# Patient Record
Sex: Male | Born: 1950 | Race: White | Hispanic: No | Marital: Married | Smoking: Never smoker
Health system: Southern US, Community
[De-identification: ages and names within clinical notes are randomized; demographics above are authoritative.]

## PROBLEM LIST (undated history)

## (undated) DIAGNOSIS — E785 Hyperlipidemia, unspecified: Secondary | ICD-10-CM

## (undated) DIAGNOSIS — G43509 Persistent migraine aura without cerebral infarction, not intractable, without status migrainosus: Secondary | ICD-10-CM

## (undated) DIAGNOSIS — R609 Edema, unspecified: Secondary | ICD-10-CM

## (undated) DIAGNOSIS — I10 Essential (primary) hypertension: Secondary | ICD-10-CM

## (undated) DIAGNOSIS — K279 Peptic ulcer, site unspecified, unspecified as acute or chronic, without hemorrhage or perforation: Secondary | ICD-10-CM

## (undated) DIAGNOSIS — D509 Iron deficiency anemia, unspecified: Secondary | ICD-10-CM

## (undated) DIAGNOSIS — Z6841 Body Mass Index (BMI) 40.0 and over, adult: Secondary | ICD-10-CM

## (undated) DIAGNOSIS — N289 Disorder of kidney and ureter, unspecified: Secondary | ICD-10-CM

## (undated) DIAGNOSIS — I4510 Unspecified right bundle-branch block: Secondary | ICD-10-CM

## (undated) DIAGNOSIS — M109 Gout, unspecified: Secondary | ICD-10-CM

## (undated) DIAGNOSIS — I4821 Permanent atrial fibrillation: Secondary | ICD-10-CM

## (undated) DIAGNOSIS — K219 Gastro-esophageal reflux disease without esophagitis: Secondary | ICD-10-CM

## (undated) DIAGNOSIS — G4733 Obstructive sleep apnea (adult) (pediatric): Secondary | ICD-10-CM

## (undated) DIAGNOSIS — E119 Type 2 diabetes mellitus without complications: Secondary | ICD-10-CM

## (undated) DIAGNOSIS — I255 Ischemic cardiomyopathy: Secondary | ICD-10-CM

## (undated) DIAGNOSIS — I5022 Chronic systolic (congestive) heart failure: Secondary | ICD-10-CM

## (undated) DIAGNOSIS — K921 Melena: Secondary | ICD-10-CM

## (undated) DIAGNOSIS — I872 Venous insufficiency (chronic) (peripheral): Secondary | ICD-10-CM

## (undated) HISTORY — DX: Venous insufficiency (chronic) (peripheral): I87.2

## (undated) HISTORY — DX: Essential (primary) hypertension: I10

## (undated) HISTORY — PX: OTHER SURGICAL HISTORY: SHX169

## (undated) HISTORY — DX: Permanent atrial fibrillation: I48.21

## (undated) HISTORY — DX: Gout, unspecified: M10.9

## (undated) HISTORY — DX: Morbid (severe) obesity due to excess calories: E66.01

## (undated) HISTORY — PX: KNEE SURGERY: SHX244

## (undated) HISTORY — DX: Edema, unspecified: R60.9

## (undated) HISTORY — DX: Obstructive sleep apnea (adult) (pediatric): G47.33

## (undated) HISTORY — PX: SHOULDER SURGERY: SHX246

## (undated) HISTORY — PX: VEIN BYPASS SURGERY: SHX833

## (undated) HISTORY — DX: Type 2 diabetes mellitus without complications: E11.9

## (undated) HISTORY — DX: Gastro-esophageal reflux disease without esophagitis: K21.9

## (undated) HISTORY — DX: Unspecified right bundle-branch block: I45.10

## (undated) HISTORY — DX: Body Mass Index (BMI) 40.0 and over, adult: Z684

## (undated) HISTORY — DX: Melena: K92.1

## (undated) HISTORY — DX: Peptic ulcer, site unspecified, unspecified as acute or chronic, without hemorrhage or perforation: K27.9

## (undated) HISTORY — DX: Ischemic cardiomyopathy: I25.5

## (undated) HISTORY — DX: Iron deficiency anemia, unspecified: D50.9

## (undated) HISTORY — DX: Persistent migraine aura without cerebral infarction, not intractable, without status migrainosus: G43.509

## (undated) HISTORY — DX: Hyperlipidemia, unspecified: E78.5

## (undated) HISTORY — DX: Chronic systolic (congestive) heart failure: I50.22

---

## 2014-10-10 ENCOUNTER — Encounter: Payer: Self-pay | Admitting: *Deleted

## 2014-10-19 ENCOUNTER — Ambulatory Visit: Payer: Self-pay | Admitting: Internal Medicine

## 2014-11-06 ENCOUNTER — Encounter: Payer: Self-pay | Admitting: Internal Medicine

## 2014-11-06 ENCOUNTER — Encounter: Payer: Self-pay | Admitting: *Deleted

## 2014-11-06 ENCOUNTER — Ambulatory Visit (INDEPENDENT_AMBULATORY_CARE_PROVIDER_SITE_OTHER): Payer: Medicare FFS | Admitting: Internal Medicine

## 2014-11-06 VITALS — BP 90/60 | HR 79 | Ht 70.0 in | Wt 262.2 lb

## 2014-11-06 DIAGNOSIS — I255 Ischemic cardiomyopathy: Secondary | ICD-10-CM | POA: Diagnosis not present

## 2014-11-06 NOTE — Progress Notes (Signed)
ELECTROPHYSIOLOGY CONSULT NOTE  Patient ID: Donald Dunlap, MRN: [id number], DOB/AGE: 64-Dec-1952 64 y.o. Admit date: (Not on file) Date of Consult: 11/06/2014  Primary Physician: Olen Cordial, MD Primary Cardiologist: new Octavio Manns)  Chief Complaint: 2 nd opinion   HPI Donald Dunlap is a 65 y.o. male  With a complex cardiac history dating back to 2006 when he underwent bypass at [facility]. It sounds like was all arterial conduits. 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  He has long-standing symptoms of congestive heart failure manifested by dyspnea edema orthopnea nocturnal dyspnea. This is been gradually progressive but much worse in the last year. He is currently unable to walk 50 feet. He sleeps in a chair.    He has been hospitalized twice at [facility] in the last 4 months; unfortunately these data were requested but not delivered. He apparently has an ejection fraction between 20-30%. He has renal insufficiency with a creatinine of about 2 (medical records which were reviewed from July 2015 demonstrated creatinine 1.5) He takes a complicated and to me confusing diuretic regime.  He has sleep apnea but does not treated as he sits sleeping up in a chair. He is salt averse and tries to restrict his fluid intake.  He has noted progressive dyspnea and peripheral edema as noted above.  He has no palpitations. He's had no syncope.  He has known persistent atrial fibrillation; he is not on anticoagulation because of cost considerations.  He is now down in [place] from [place] because he saw a story on television about some lady who got a PUMP (presumably LVAD) and went from being nonambulatory to walking 5 miles.      Past Medical History  Diagnosis Date  . Migraine aura, persistent   . Chronic systolic congestive heart failure, NYHA class  3   . HLD (hyperlipidemia)   . Venous insufficiency   . Diabetes   . Blood in stool   . Peptic ulcer   . Ischemic cardiomyopathy   . Gout   . Esophageal reflux   . HTN (hypertension)   . Edema   . RBBB LAFB   . OSA (obstructive sleep apnea)   . Atrial fibrillation, permanent   . Morbid obesity with BMI of 40.0-44.9, adult       Surgical History:  Past Surgical History  Procedure Laterality Date  . Vein bypass surgery    . Shoulder surgery      bilateral  . Left 3 toes    . Right hand surgery    . Knee surgery      arthroscopic R & L     Home Meds: Prior to Admission medications   Medication Sig Start Date End Date Taking? Authorizing Provider  allopurinol (ZYLOPRIM) 100 MG tablet Take 100 mg by mouth daily.   Yes Historical Provider, MD  Ascorbic Acid (VITAMIN C) 1000 MG tablet Take 1,000 mg by mouth daily.   Yes Historical Provider, MD  aspirin 325 MG tablet Take 325 mg by mouth daily.   Yes Historical Provider, MD  benazepril (LOTENSIN) 5 MG tablet Take 5 mg by mouth daily.   Yes Historical Provider, MD  Cholecalciferol (VITAMIN D-3) 1000 UNITS CAPS Take 1 capsule by mouth daily.   Yes Historical Provider, MD  colchicine 0.6 MG tablet Take 0.6 mg by mouth 2 (two) times daily.   Yes Historical Provider, MD  furosemide (LASIX)  40 MG tablet Take 40 mg by mouth as directed. Take 2 tablets (80 mg) by mouth in the morning & 1 tablet (40 mg) in the evening.   Yes Historical Provider, MD  glipiZIDE (GLUCOTROL) 5 MG tablet Take 5 mg by mouth daily before breakfast.   Yes Historical Provider, MD  insulin glargine (LANTUS) 100 UNIT/ML injection Inject 50 Units into the skin at bedtime.   Yes Historical Provider, MD  insulin lispro (HUMALOG) 100 UNIT/ML injection Inject 15 Units into the skin 3 (three) times daily before meals.   Yes Historical Provider, MD  metolazone (ZAROXOLYN) 5 MG tablet Take 5 mg by mouth as directed. Take 5 mg metolazone by mouth every other day, alternating  with spironolactone.   Yes Historical Provider, MD  metoprolol succinate (TOPROL-XL) 100 MG 24 hr tablet Take 100 mg by mouth daily. Take with or immediately following a meal.   Yes Historical Provider, MD  Omega-3 Fatty Acids (FISH OIL) 1000 MG CAPS Take 1 capsule by mouth daily.   Yes Historical Provider, MD  pantoprazole (PROTONIX) 40 MG tablet Take 40 mg by mouth 2 (two) times daily.   Yes Historical Provider, MD  ranitidine (ZANTAC) 150 MG tablet Take 150 mg by mouth daily.   Yes Historical Provider, MD  simvastatin (ZOCOR) 40 MG tablet Take 40 mg by mouth daily.   Yes Historical Provider, MD  spironolactone (ALDACTONE) 25 MG tablet Take 25 mg by mouth as directed. Take 25 mg of spironolactone by mouth every other day, alternating with metolazone.   Yes Historical Provider, MD  triamcinolone (KENALOG) 0.025 % cream Apply 1 application topically 2 (two) times daily.   Yes Historical Provider, MD     Allergies:  Allergies  Allergen Reactions  . Latex     Rash   . Oxycodone     hallucinations    History   Social History  . Marital Status: Married    Spouse Name: N/A  . Number of Children: N/A  . Years of Education: N/A   Occupational History  . Not on file.   Social History Main Topics  . Smoking status: Never Smoker   . Smokeless tobacco: Not on file  . Alcohol Use: No  . Drug Use: No  . Sexual Activity: Not on file   Other Topics Concern  . Not on file   Social History Narrative     History reviewed. No pertinent family history.   ROS:  Please see the history of present illness.     All other systems reviewed and negative.    Physical Exam: Blood pressure 90/60, pulse 79, height 5\' 10"  (1.778 m), weight 262 lb 3.2 oz (118.933 kg). General: Well developed, Morbidly obese   male in no acute distress. Head: Normocephalic, atraumatic, sclera non-icteric, no xanthomas, nares are without discharge. EENT: normal Lymph Nodes:  none Back: without scoliosis/kyphosis  , no CVAT Neck JVP>10   Lungs: Clear bilaterally to auscultation with decreased breath sounds halfway up bilaterally; respirations are mildly labored unlabored. Heart irregularly irregular with S1 S2.  2/6 systolic murmur , rubs, or gallops appreciated. Abdomen: Soft, non-tender, non-distended with normoactive bowel sounds. No hepatomegaly. No rebound/guarding. No obvious abdominal masses. Msk:  Strength and tone appear normal for age. Extremities: No clubbing or cyanosis.  3+ edema.  Distal pedal pulses are 2+ and equal bilaterally. Skin: Warm and Dry Neuro: Alert and oriented X 3. CN III-XII intact Grossly normal sensory and motor function . Psych:  Responds to  questions appropriately with a normal affect.      Labs:  Radiology/Studies:  No results found.  EKG: atrial fibrillation 79 13/38 LAD RBBB ALMI LVH   Assessment and Plan:  Congestive heart failure class III  Atrial FIB  Ischemic cardiomyopathy  Morbid obesity  Obstructive sleep apnea  Renal insufficiency   Unfortunately, we have little data to inform any decision-making this evening.    He is clearly volume overloaded and needs more aggressive diuresis. Knowing what to do however I think depends on his renal function. It would be my bias that this be done in hospital and will discuss this with the heart failure team. I suspect he would benefit from torsemide and I have written that name down for the family so they can look into the cost as financial considerations unfortunately weigh heavily here. (See below).  He has been 10 years since his catheterization. It sounds like he had all arterial conduits, but with his deteriorating functional status, catheterization is indicated unless it is a prohibitive risk. Right heart catheterization might also be beneficial. We await echocardiogram from [facility] to get a sense of LV RV function coronary pressures and atrial size.  He is noncompliant with his CPAP.  This is in part related to his heart failure as he is orthopneic but also has morbid obesity and noncompliance.  He does not take anticoagulation for atrial fibrillation because of cost. His CHADS-VASc score is greater than or equal to  6  I have given him the information for the low income support program    Sherryl Manges

## 2014-11-06 NOTE — Patient Instructions (Signed)
We will call you once we have received and reviewed records from [facility].

## 2014-11-07 ENCOUNTER — Telehealth: Payer: Self-pay | Admitting: Internal Medicine

## 2014-11-07 NOTE — Telephone Encounter (Signed)
Follow up      Did you get the records from [facility]?

## 2014-11-07 NOTE — Telephone Encounter (Signed)
New message      Calling to see if you received the records [facility]

## 2014-11-07 NOTE — Telephone Encounter (Signed)
Informed them that records received today and we will contact them once Dr. Graciela HusbandsKlein has reviewed them. They verbalized understanding.

## 2014-11-13 ENCOUNTER — Telehealth: Payer: Self-pay | Admitting: Internal Medicine

## 2014-11-13 NOTE — Telephone Encounter (Signed)
Informed that Dr. Graciela HusbandsKlein reviewed but was waiting to speak with a colleague before making plan of care decision.  Explained that Dr. Graciela HusbandsKlein is out of the office this week, so it would be next week before I can check with him.  She is aware I will call them once Dr. Graciela HusbandsKlein gives me recommendations. She is agreeable to this plan.

## 2014-11-13 NOTE — Telephone Encounter (Signed)
Was in [place] last week, was to have records sent here and to be called with plan of treatment, has not heard and is following up, pls call [phone]

## 2014-11-21 ENCOUNTER — Telehealth: Payer: Self-pay | Admitting: Interventional Cardiology

## 2014-11-21 ENCOUNTER — Telehealth: Payer: Self-pay | Admitting: Internal Medicine

## 2014-11-21 NOTE — Telephone Encounter (Signed)
Informed wife that Dr. Graciela HusbandsKlein had not reviewed yet or hasn't heard from colleague.   She understands I will call her once he has reviewed.

## 2014-11-21 NOTE — Telephone Encounter (Signed)
New Msg         Pt wife Dondra SpryGail calling.    Please call back. No details given.

## 2014-11-21 NOTE — Telephone Encounter (Signed)
Error. Selected wrong provider name.

## 2014-11-21 NOTE — Telephone Encounter (Signed)
New Msg         Pt wife calling today.   Please return call back. No details given.

## 2014-11-26 ENCOUNTER — Telehealth: Payer: Self-pay | Admitting: Internal Medicine

## 2014-11-26 NOTE — Telephone Encounter (Signed)
New message      Want to know if Dr Graciela HusbandsKlein has looked at his records from [facility]?---OK to call tomorrow

## 2014-11-26 NOTE — Telephone Encounter (Signed)
Will route this message to Dr Graciela HusbandsKlein and covering nurse for further review and follow-up with the pt

## 2014-11-27 NOTE — Telephone Encounter (Signed)
Pt wife requesting call back today

## 2014-11-27 NOTE — Telephone Encounter (Signed)
Advised Dr. Graciela HusbandsKlein still reviewing. She was not upset and understood. She understands I will call her as soon as we have an answer and agreeable.

## 2014-12-03 NOTE — Telephone Encounter (Signed)
F/U         Pt wife calling.    Please call.

## 2014-12-05 NOTE — Telephone Encounter (Signed)
F/U       Pt wife calling back today.     Please call back states nurse is aware of concern.

## 2014-12-06 NOTE — Telephone Encounter (Signed)
Patient called earlier this morning because she still has not heard from anyone. I apologized and explained that Dr. Graciela HusbandsKlein reviewed records yesterday and discussed with heart failure physician. Advised plan is for him to be seen by Dr. Gala RomneyBensimhon, in the heart failure clinic, before making a decision on ICD placement. Also told patient that I would have Dr. Graciela HusbandsKlein call them today to discuss this further. She is satisfied with that. She is also aware that someone from heart failure clinic will contact them today to schedule office visit for next week.

## 2014-12-10 ENCOUNTER — Ambulatory Visit (HOSPITAL_COMMUNITY)
Admission: RE | Admit: 2014-12-10 | Discharge: 2014-12-10 | Disposition: A | Discharge status: Home / Self Care | Payer: Medicare FFS | Source: Ambulatory Visit | Attending: Internal Medicine | Admitting: Internal Medicine

## 2014-12-10 VITALS — BP 94/60 | HR 60 | Resp 20 | Wt 265.0 lb

## 2014-12-10 DIAGNOSIS — I4891 Unspecified atrial fibrillation: Secondary | ICD-10-CM | POA: Insufficient documentation

## 2014-12-10 DIAGNOSIS — I482 Chronic atrial fibrillation, unspecified: Secondary | ICD-10-CM

## 2014-12-10 DIAGNOSIS — I517 Cardiomegaly: Secondary | ICD-10-CM | POA: Insufficient documentation

## 2014-12-10 DIAGNOSIS — I4510 Unspecified right bundle-branch block: Secondary | ICD-10-CM | POA: Insufficient documentation

## 2014-12-10 DIAGNOSIS — I5022 Chronic systolic (congestive) heart failure: Secondary | ICD-10-CM | POA: Insufficient documentation

## 2014-12-10 DIAGNOSIS — I2510 Atherosclerotic heart disease of native coronary artery without angina pectoris: Secondary | ICD-10-CM

## 2014-12-10 DIAGNOSIS — J9611 Chronic respiratory failure with hypoxia: Secondary | ICD-10-CM | POA: Diagnosis not present

## 2014-12-10 LAB — COMPREHENSIVE METABOLIC PANEL
ALK PHOS: 82 U/L (ref 39–117)
ALT: 21 U/L (ref 0–53)
AST: 23 U/L (ref 0–37)
Albumin: 3.7 g/dL (ref 3.5–5.2)
Anion gap: 10 (ref 5–15)
BUN: 53 mg/dL — ABNORMAL HIGH (ref 6–23)
CHLORIDE: 100 mmol/L (ref 96–112)
CO2: 28 mmol/L (ref 19–32)
CREATININE: 1.68 mg/dL — AB (ref 0.50–1.35)
Calcium: 10 mg/dL (ref 8.4–10.5)
GFR calc Af Amer: 48 mL/min — ABNORMAL LOW (ref >90)
GFR, EST NON AFRICAN AMERICAN: 42 mL/min — AB (ref >90)
GLUCOSE: 148 mg/dL — AB (ref 70–99)
Potassium: 3.9 mmol/L (ref 3.5–5.1)
Sodium: 138 mmol/L (ref 135–145)
Total Bilirubin: 1.3 mg/dL — ABNORMAL HIGH (ref 0.3–1.2)
Total Protein: 8.4 g/dL — ABNORMAL HIGH (ref 6.0–8.3)

## 2014-12-10 LAB — BASIC METABOLIC PANEL
Anion gap: 10 (ref 5–15)
BUN: 53 mg/dL — AB (ref 6–23)
CHLORIDE: 98 mmol/L (ref 96–112)
CO2: 30 mmol/L (ref 19–32)
Calcium: 9.9 mg/dL (ref 8.4–10.5)
Creatinine, Ser: 1.81 mg/dL — ABNORMAL HIGH (ref 0.50–1.35)
GFR calc Af Amer: 44 mL/min — ABNORMAL LOW (ref >90)
GFR calc non Af Amer: 38 mL/min — ABNORMAL LOW (ref >90)
Glucose, Bld: 150 mg/dL — ABNORMAL HIGH (ref 70–99)
POTASSIUM: 4.6 mmol/L (ref 3.5–5.1)
Sodium: 138 mmol/L (ref 135–145)

## 2014-12-10 LAB — CBC
HEMATOCRIT: 47.3 % (ref 39.0–52.0)
HEMOGLOBIN: 15.7 g/dL (ref 13.0–17.0)
MCH: 28.2 pg (ref 26.0–34.0)
MCHC: 33.2 g/dL (ref 30.0–36.0)
MCV: 84.9 fL (ref 78.0–100.0)
Platelets: 144 10*3/uL — ABNORMAL LOW (ref 150–400)
RBC: 5.57 MIL/uL (ref 4.22–5.81)
RDW: 16.7 % — AB (ref 11.5–15.5)
WBC: 10.9 10*3/uL — ABNORMAL HIGH (ref 4.0–10.5)

## 2014-12-10 LAB — BRAIN NATRIURETIC PEPTIDE: B Natriuretic Peptide: 216 pg/mL — ABNORMAL HIGH (ref 0.0–100.0)

## 2014-12-10 LAB — URIC ACID: Uric Acid, Serum: 9.6 mg/dL — ABNORMAL HIGH (ref 4.0–7.8)

## 2014-12-10 NOTE — Progress Notes (Signed)
[facility] HF CLINIC NEW PATIENT CONSULT    Patient ID: Donald Dunlap, male   DOB: [birth year], 64 y.o.   MRN: [id number] DJS:HFWYOV harris Primary Cardiologist: Dr Hyacinth Meeker , [facility] Referring: Graciela Husbands (EP)  HPI: Donald Dunlap is a 9 year old referred to the HF clinic by Dr Graciela Husbands. Donald Dunlap was seen by Dr Graciela Husbands 11/06/14 for ICD and was requesting more information about LVAD. He is referred by Dr. Graciela Husbands to the HF Clinic for further evaluation.   Donald Dunlap has a history of CAD with CABG in 2006 at [facility] complicated by sternal dehiscence,chronic atrial fib not on anticoagulation due to cost and risks, h/o multiple strokes, COPD on home O2, DM2, gout, ICM, obesity, OSA, RBBB, esophageal reflux and venous insufficiency.   Last LHC 2006.   He was last hospitalized at [facility] about 4 months ago. He apparently has an ejection fraction between 20-30%. He has renal insufficiency with a creatinine of 1.5-  2.0.  Has chronic NYHA IIIB symptoms with dyspnea on walking from bedroom to kitchen.  Mild dyspnea at rest on occasion. Says he has lost about 20 pounds over the past few months and feels some better. Has cut out soda and now drinks water. Also cutting back on bread and carbohydrates. Has not weighed in the last 3 days due to L foot pain. Has ongoing swelling in legs but this is improved.  Limited mobility due to dyspnea. Uses a cane outside the house.  Uses CPAP nightly. Taking all medications. Lives at home with his wife. Denies smoking cigarettes or drinking alcohol (though he smells like smoke). Takes extra lasix about once a week. Wife put out his medicines.  Review of Systems:     Cardiac Review of Systems: {Y] = yes  = no  Chest Pain [    ]  Resting SOB Cove.Etienne   ] Exertional SOB  [ y ]  Orthopnea [  ]   Pedal Edema [  y ]    Palpitations [  ] Syncope  [  ]   Presyncope [   ]  General Review of Systems: [Y] = yes [  ]=no Constitional: recent weight change Cove.Etienne  ]; anorexia [  ]; fatigue Cove.Etienne  ]; nausea [  ];  night sweats [  ]; fever [  ];  chills [  ];  Eyes : blurred vision [  ]; diplopia [   ]; vision changes [  ];  Amaurosis fugax[  ]; Resp: cough Cove.Etienne  ];  wheezing[  ];  hemoptysis[  ];  PND [  ];  GI:  gallstones[  ], vomiting[  ];  dysphagia[  ]; melena[  ];  hematochezia [  ]; heartburn[  ];   GU: kidney stones [  ]; hematuria[  ];   dysuria [  ];  nocturia[  ]; incontinence [  ];             Skin: rash, swelling[  ];, hair loss[  ];  peripheral edema[y  ];  or itching[  ]; Musculosketetal: myalgias[  ];  joint swelling[  ];  joint erythema[  ];  joint pain[y  ];  back pain[  ];  Heme/Lymph: bruising[  ];  bleeding[  ];  anemia[  ];  Neuro: TIA[  ];  headaches[  ];  stroke[  ];  vertigo[  ];  seizures[  ];   paresthesias[  ];  difficulty walking[ y ];  Psych:depression[  ]; anxiety[  ];  Endocrine: diabetes[y  ];  thyroid dysfunction[  ];  Other:  Past Medical History  Diagnosis Date  . Migraine aura, persistent   . Chronic systolic congestive heart failure, NYHA class 3   . HLD (hyperlipidemia)   . Venous insufficiency   . Diabetes   . Blood in stool   . Peptic ulcer   . Ischemic cardiomyopathy   . Gout   . Esophageal reflux   . HTN (hypertension)   . Edema   . RBBB LAFB   . OSA (obstructive sleep apnea)   . Atrial fibrillation, permanent   . Morbid obesity with BMI of 40.0-44.9, adult      SH:  History   Social History  . Marital Status: Married    Spouse Name: N/A  . Number of Children: N/A  . Years of Education: N/A   Occupational History  . Not on file.   Social History Main Topics  . Smoking status: Never Smoker   . Smokeless tobacco: Not on file  . Alcohol Use: No  . Drug Use: No  . Sexual Activity: Not on file   Other Topics Concern  . Not on file   Social History Narrative    FH: No h/o familial cardiomyopathy or connective tissue disease   Current Outpatient Prescriptions  Medication Sig Dispense Refill  . allopurinol (ZYLOPRIM) 100 MG  tablet Take 100 mg by mouth daily.    . Ascorbic Acid (VITAMIN C) 1000 MG tablet Take 1,000 mg by mouth daily.    Marland Kitchen aspirin 325 MG tablet Take 325 mg by mouth daily.    . benazepril (LOTENSIN) 5 MG tablet Take 5 mg by mouth daily.    . Cholecalciferol (VITAMIN D-3) 1000 UNITS CAPS Take 1 capsule by mouth daily.    . colchicine 0.6 MG tablet Take 0.6 mg by mouth daily.     . furosemide (LASIX) 40 MG tablet Take 40 mg by mouth 2 (two) times daily.     Marland Kitchen glipiZIDE (GLUCOTROL) 5 MG tablet Take 5 mg by mouth 2 (two) times daily before a meal.     . insulin glargine (LANTUS) 100 UNIT/ML injection Inject 50 Units into the skin at bedtime.    . insulin lispro (HUMALOG) 100 UNIT/ML injection Inject 15 Units into the skin 3 (three) times daily before meals.    . metolazone (ZAROXOLYN) 5 MG tablet Take 5 mg by mouth as directed. Take 5 mg metolazone by mouth every other day, alternating with spironolactone.    . metoprolol succinate (TOPROL-XL) 100 MG 24 hr tablet Take 100 mg by mouth daily. Take with or immediately following a meal.    . Omega-3 Fatty Acids (FISH OIL) 1000 MG CAPS Take 1 capsule by mouth daily.    . pantoprazole (PROTONIX) 40 MG tablet Take 40 mg by mouth 2 (two) times daily.    . ranitidine (ZANTAC) 150 MG tablet Take 150 mg by mouth daily.    . simvastatin (ZOCOR) 40 MG tablet Take 40 mg by mouth daily.    Marland Kitchen spironolactone (ALDACTONE) 25 MG tablet Take 25 mg by mouth as directed. Take 25 mg of spironolactone by mouth every other day, alternating with metolazone.    . triamcinolone (KENALOG) 0.025 % cream Apply 1 application topically 2 (two) times daily.     No current facility-administered medications for this encounter.    Filed Vitals:   12/10/14 1007  BP: 94/60  Pulse: 60  Resp: 20  Weight: 265 lb (120.203 kg)  SpO2: 95%   sats 87-88% on RA with hall work PHYSICAL EXAM: General:  Chronically ill appearing. Arrived in wheelchair.  No resp difficulty HEENT: normal Neck:  supple. JVP 5-6at. Carotids 2+ bilaterally; no bruits. No lymphadenopathy or thryomegaly appreciated. Cor: PMI normal. Irregular  rate & rhythm. No rubs, gallops or murmurs. Sternal scar. No S3  Lungs: clear Abdomen: obese, soft, nontender, nondistended. No hepatosplenomegaly. No bruits or masses. Good bowel sounds. Extremities: no cyanosis, clubbing, rash, R and LLE tr-1+ edema. With chronic venous stasis changes. Chronic hyperpigmetation noted on the anterior aspects of RLE and LLE.  Neuro: alert & orientedx3, cranial nerves grossly intact. Moves all 4 extremities w/o difficulty. Affect pleasant.   ASSESSMENT & PLAN: 1. Chronic Systolic HF . [facility] with EF 20-25%. ICM 2006. Does not have ICD.  NYHA IIIb-IV.  Volume status appears stable. Continue lasix 40 mg lasix twice a day and 25 mg spiro daily.  -Continue  metoprolol Succinate 100 mg daily -Continue benazepril 5 mg daily.  Check BMET and BNP now.  Reinforced daily weights, limiting fluid intake to < 2 liters, and low salt food choices.  Will need [facility]HC to further sort out hemodynamics.  2. ICM- CAD- 2006 No CP on aspirin, bb, and statin.  3. Chronic A fib- he has not been on anticoagulants due to cost.     --CHADSVasc - 6 4. DM: Per PCP 5. CKD: Baseline creatinine seems to be 1.6-1.8 Check BMET now 6. Acute gout 7. Chronic respiratory failure. Will continue home O2 and refer to pulmonary rehab.   CLEGG,AMY NP-C  11:30 AM  Patient seen and examined with Tonye BecketAmy Clegg, NP. We discussed all aspects of the encounter. I agree with the assessment and plan as stated above.   NYHA IIIb. This is likely multifactorial due to lung disease, HF, obesity and deconditioning. Volume status ok. Not ideal candidate for advanced therapies except maybe inotropes but I do not think he is there yet. Unfortunately I do not think he can perform CPX easily especially with need for home O2.For now will keep HF meds the same. Recheck echo  and labs. Will also likely need PFTs. Had long talk about need for daily weights and the use of sliding scale diuretics. Also strongly suggested need for full anticoagulation which he is still reluctant about.   I have encourages him to wear his O2 will all exertion and we will refer to Pulmonary Rehab [place]. Suspect foot pain is gout. Will start colchicine. Check uric acid.   We will see back in 3-4 weeks.   Total time personally spent 45 minutes. Over half that time spent discussing above.   Verble Styron,MD 10:24 PM

## 2014-12-10 NOTE — Patient Instructions (Signed)
Labs today  You have been referred to Pulmonary Rehab, they will contact you to arrange orientation  Your physician recommends that you schedule a follow-up appointment in: 4 weeks with echocardiogram  Your physician has requested that you have an echocardiogram. Echocardiography is a painless test that uses sound waves to create images of your heart. It provides your doctor with information about the size and shape of your heart and how well your heart's chambers and valves are working. This procedure takes approximately one hour. There are no restrictions for this procedure.

## 2014-12-13 DIAGNOSIS — J9610 Chronic respiratory failure, unspecified whether with hypoxia or hypercapnia: Secondary | ICD-10-CM | POA: Insufficient documentation

## 2014-12-13 DIAGNOSIS — I2510 Atherosclerotic heart disease of native coronary artery without angina pectoris: Secondary | ICD-10-CM | POA: Insufficient documentation

## 2014-12-13 DIAGNOSIS — I482 Chronic atrial fibrillation, unspecified: Secondary | ICD-10-CM | POA: Insufficient documentation

## 2014-12-13 DIAGNOSIS — I5022 Chronic systolic (congestive) heart failure: Secondary | ICD-10-CM | POA: Insufficient documentation

## 2015-01-07 ENCOUNTER — Encounter (HOSPITAL_COMMUNITY): Payer: Commercial Managed Care - HMO

## 2015-01-07 ENCOUNTER — Ambulatory Visit (HOSPITAL_COMMUNITY): Payer: Medicare FFS

## 2016-05-15 ENCOUNTER — Other Ambulatory Visit: Payer: Self-pay | Admitting: Nephrology

## 2016-05-15 ENCOUNTER — Other Ambulatory Visit (HOSPITAL_COMMUNITY): Payer: Self-pay | Admitting: Nephrology

## 2016-05-15 DIAGNOSIS — N183 Chronic kidney disease, stage 3 unspecified: Secondary | ICD-10-CM

## 2016-05-18 ENCOUNTER — Ambulatory Visit (HOSPITAL_COMMUNITY)
Admission: RE | Admit: 2016-05-18 | Discharge: 2016-05-18 | Disposition: A | Discharge status: Home / Self Care | Payer: Medicare Other | Source: Ambulatory Visit | Attending: Nephrology | Admitting: Nephrology

## 2016-05-18 DIAGNOSIS — N183 Chronic kidney disease, stage 3 unspecified: Secondary | ICD-10-CM

## 2016-05-18 DIAGNOSIS — K8020 Calculus of gallbladder without cholecystitis without obstruction: Secondary | ICD-10-CM | POA: Diagnosis not present

## 2016-06-08 ENCOUNTER — Ambulatory Visit (HOSPITAL_COMMUNITY): Payer: Medicare FFS

## 2016-09-03 ENCOUNTER — Encounter (INDEPENDENT_AMBULATORY_CARE_PROVIDER_SITE_OTHER): Payer: Self-pay | Admitting: Internal Medicine

## 2016-10-02 ENCOUNTER — Encounter (INDEPENDENT_AMBULATORY_CARE_PROVIDER_SITE_OTHER): Payer: Self-pay

## 2016-10-02 ENCOUNTER — Ambulatory Visit (INDEPENDENT_AMBULATORY_CARE_PROVIDER_SITE_OTHER): Payer: Medicare Other | Admitting: Internal Medicine

## 2016-10-02 ENCOUNTER — Encounter (INDEPENDENT_AMBULATORY_CARE_PROVIDER_SITE_OTHER): Payer: Self-pay | Admitting: Internal Medicine

## 2016-10-02 DIAGNOSIS — D508 Other iron deficiency anemias: Secondary | ICD-10-CM

## 2016-10-02 DIAGNOSIS — D509 Iron deficiency anemia, unspecified: Secondary | ICD-10-CM

## 2016-10-02 HISTORY — DX: Iron deficiency anemia, unspecified: D50.9

## 2016-10-02 LAB — IRON AND TIBC
%SAT: 7 % — ABNORMAL LOW (ref 15–60)
Iron: 31 ug/dL — ABNORMAL LOW (ref 50–180)
TIBC: 417 ug/dL (ref 250–425)
UIBC: 386 ug/dL (ref 125–400)

## 2016-10-02 LAB — CBC
HEMATOCRIT: 43 % (ref 38.5–50.0)
HEMOGLOBIN: 13.2 g/dL (ref 13.2–17.1)
MCH: 26 pg — AB (ref 27.0–33.0)
MCHC: 30.7 g/dL — ABNORMAL LOW (ref 32.0–36.0)
MCV: 84.6 fL (ref 80.0–100.0)
MPV: 10 fL (ref 7.5–12.5)
Platelets: 265 10*3/uL (ref 140–400)
RBC: 5.08 MIL/uL (ref 4.20–5.80)
RDW: 17.5 % — ABNORMAL HIGH (ref 11.0–15.0)
WBC: 9.4 10*3/uL (ref 3.8–10.8)

## 2016-10-02 LAB — FERRITIN: Ferritin: 63 ng/mL (ref 20–380)

## 2016-10-02 NOTE — Patient Instructions (Signed)
OV in 3 months. 

## 2016-10-02 NOTE — Progress Notes (Addendum)
Subjective:    Patient ID: Donald Dunlap, male    DOB: Jan 22, 1951, 66 y.o.   MRN: [id number]  HPI  Referred by Dr. Fausto SkillernBefakadu for anemia/colonoscopy. He cannot tell me when his last colonoscopy was. Has seen GI in [facility] in the past. Appetite is good. No weight loss. He usually has a BM daily or every other day. No melena or BRRB.  Says he occasionally see blood from his hemorrhoids.  Hx of venous insufficiency Hx of heart disease, diabetic all his life. H of atrial fib and maintained on Warfarin.  07/28/2016 BUN 46, Creatinine 1.76, TIBC 376, UIBC 335, Iron 41, Iron saturation 11, ferritin 70, H and H 14.1 and 43.8 Last colonoscopy in 2009: Internal hemorrhoids and diverticulosis. No polyps.   Review of Systems Past Medical History:  Diagnosis Date  . Atrial fibrillation, permanent (HCC)   . Blood in stool   . Chronic systolic congestive heart failure, NYHA class 3 (HCC)   . Diabetes (HCC)   . Edema   . Esophageal reflux   . Gout   . HLD (hyperlipidemia)   . HTN (hypertension)   . Iron deficiency anemia 10/02/2016  . Ischemic cardiomyopathy   . Migraine aura, persistent   . Morbid obesity with BMI of 40.0-44.9, adult (HCC)   . OSA (obstructive sleep apnea)   . Peptic ulcer   . RBBB LAFB   . Venous insufficiency     Past Surgical History:  Procedure Laterality Date  . KNEE SURGERY     arthroscopic R & L  . left 3 toes    . right hand surgery    . SHOULDER SURGERY     bilateral  . VEIN BYPASS SURGERY      Allergies  Allergen Reactions  . Latex     Rash   . Oxycodone     hallucinations    Current Outpatient Prescriptions on File Prior to Visit  Medication Sig Dispense Refill  . allopurinol (ZYLOPRIM) 100 MG tablet Take 100 mg by mouth daily.    Marland Kitchen. aspirin 325 MG tablet Take 325 mg by mouth daily.    . furosemide (LASIX) 40 MG tablet Take 60 mg by mouth 2 (two) times daily.     Marland Kitchen. glipiZIDE (GLUCOTROL) 5 MG tablet Take 5 mg by mouth 2 (two) times daily before  a meal.     . insulin glargine (LANTUS) 100 UNIT/ML injection Inject 25 Units into the skin at bedtime.     . insulin lispro (HUMALOG) 100 UNIT/ML injection Inject 15 Units into the skin 3 (three) times daily before meals.    . metolazone (ZAROXOLYN) 5 MG tablet Take 5 mg by mouth as directed. Take 5 mg metolazone by mouth every other day, alternating with spironolactone.    . metoprolol succinate (TOPROL-XL) 100 MG 24 hr tablet Take 50 mg by mouth daily. Take with or immediately following a meal.     . pantoprazole (PROTONIX) 40 MG tablet Take 40 mg by mouth 2 (two) times daily.    . simvastatin (ZOCOR) 40 MG tablet Take 40 mg by mouth daily.    Marland Kitchen. spironolactone (ALDACTONE) 25 MG tablet Take 25 mg by mouth as directed. biweekly    . Omega-3 Fatty Acids (FISH OIL) 1000 MG CAPS Take 1 capsule by mouth daily.     No current facility-administered medications on file prior to visit.        Objective:   Physical Exam Blood pressure 94/72, pulse (!) 56,  temperature 98 F (36.7 C), height 5\' 10"  (1.778 m), weight 255 lb (115.7 kg).  Alert and oriented. Skin warm and dry. Oral mucosa is moist.   . Sclera anicteric, conjunctivae is pink. Thyroid not enlarged. No cervical lymphadenopathy. Lungs clear. Heart regular rate and rhythm.  Abdomen is soft. Bowel sounds are positive. No hepatomegaly. No abdominal masses felt. No tenderness.  No edema to lower extremities.         Assessment & Plan:  Anemia. Labs values look good.  Am going to repeat CBC and iron studies. OV 3 months.

## 2016-10-05 ENCOUNTER — Encounter (INDEPENDENT_AMBULATORY_CARE_PROVIDER_SITE_OTHER): Payer: Self-pay

## 2016-10-22 ENCOUNTER — Other Ambulatory Visit (INDEPENDENT_AMBULATORY_CARE_PROVIDER_SITE_OTHER): Payer: Self-pay | Admitting: Internal Medicine

## 2016-10-22 ENCOUNTER — Telehealth (INDEPENDENT_AMBULATORY_CARE_PROVIDER_SITE_OTHER): Payer: Self-pay | Admitting: Internal Medicine

## 2016-10-22 DIAGNOSIS — Z86010 Personal history of colonic polyps: Secondary | ICD-10-CM

## 2016-10-22 NOTE — Telephone Encounter (Signed)
Ann, Colonoscopy. I have spoken with wife. Please call her number

## 2016-10-23 ENCOUNTER — Telehealth (INDEPENDENT_AMBULATORY_CARE_PROVIDER_SITE_OTHER): Payer: Self-pay | Admitting: *Deleted

## 2016-10-23 DIAGNOSIS — Z86010 Personal history of colonic polyps: Secondary | ICD-10-CM | POA: Insufficient documentation

## 2016-10-23 NOTE — Telephone Encounter (Signed)
TCS sch'd 12/02/16 -- I need to know how you want to adjust diabetics meds so I can mail instruction sheet

## 2016-10-23 NOTE — Telephone Encounter (Signed)
Patient needs trilyte 

## 2016-10-26 ENCOUNTER — Telehealth (INDEPENDENT_AMBULATORY_CARE_PROVIDER_SITE_OTHER): Payer: Self-pay | Admitting: Internal Medicine

## 2016-10-26 ENCOUNTER — Encounter (INDEPENDENT_AMBULATORY_CARE_PROVIDER_SITE_OTHER): Payer: Self-pay | Admitting: *Deleted

## 2016-10-26 MED ORDER — PEG 3350-KCL-NA BICARB-NACL 420 G PO SOLR: 4000.0000 mL | Freq: Once | ORAL | 0 refills | Status: AC

## 2016-10-26 NOTE — Telephone Encounter (Signed)
noted 

## 2016-10-26 NOTE — Telephone Encounter (Signed)
Hold Lantus, Hold evening and am Humalog. Patient is on iron and coumadin

## 2016-10-26 NOTE — Telephone Encounter (Signed)
Noted on instruction sheet 

## 2016-10-29 ENCOUNTER — Telehealth (INDEPENDENT_AMBULATORY_CARE_PROVIDER_SITE_OTHER): Payer: Self-pay | Admitting: *Deleted

## 2016-10-29 NOTE — Telephone Encounter (Signed)
Per cardiologist it is ok for patient to hold Coumadin 5 days prior to TCS sch'd 12/02/16, patient aware

## 2016-11-06 ENCOUNTER — Encounter (INDEPENDENT_AMBULATORY_CARE_PROVIDER_SITE_OTHER): Payer: Self-pay

## 2016-11-10 ENCOUNTER — Telehealth (INDEPENDENT_AMBULATORY_CARE_PROVIDER_SITE_OTHER): Payer: Self-pay | Admitting: *Deleted

## 2016-11-10 NOTE — Telephone Encounter (Signed)
Patient left message to cancel his TCS -- he said his cardiologist told him not to do it right now

## 2016-11-10 NOTE — Telephone Encounter (Signed)
noted 

## 2016-12-02 ENCOUNTER — Encounter (HOSPITAL_COMMUNITY): Payer: Self-pay

## 2016-12-02 ENCOUNTER — Ambulatory Visit (HOSPITAL_COMMUNITY): Admit: 2016-12-02 | Payer: Medicare Other | Admitting: Internal Medicine

## 2016-12-02 SURGERY — COLONOSCOPY
Anesthesia: Moderate Sedation

## 2016-12-31 ENCOUNTER — Ambulatory Visit (INDEPENDENT_AMBULATORY_CARE_PROVIDER_SITE_OTHER): Payer: Medicare Other | Admitting: Internal Medicine

## 2017-01-04 ENCOUNTER — Encounter (HOSPITAL_COMMUNITY)
Admission: RE | Admit: 2017-01-04 | Discharge: 2017-01-04 | Disposition: A | Discharge status: Home / Self Care | Payer: Medicare Other | Source: Ambulatory Visit | Attending: Nephrology | Admitting: Nephrology

## 2017-01-04 DIAGNOSIS — D509 Iron deficiency anemia, unspecified: Secondary | ICD-10-CM | POA: Insufficient documentation

## 2017-01-04 MED ORDER — SODIUM CHLORIDE 0.9 % IV SOLN
INTRAVENOUS | Status: DC
  Administered 2017-01-04: 250 mL via INTRAVENOUS

## 2017-01-04 MED ORDER — SODIUM CHLORIDE 0.9 % IV SOLN
510.0000 mg | Freq: Once | INTRAVENOUS | Status: AC
  Administered 2017-01-04: 510 mg via INTRAVENOUS
  Filled 2017-01-04 (×2): qty 17

## 2017-01-07 ENCOUNTER — Inpatient Hospital Stay (HOSPITAL_COMMUNITY): Payer: Medicare Other

## 2017-01-07 ENCOUNTER — Encounter (HOSPITAL_COMMUNITY): Payer: Self-pay | Admitting: Internal Medicine

## 2017-01-07 ENCOUNTER — Ambulatory Visit (HOSPITAL_BASED_OUTPATIENT_CLINIC_OR_DEPARTMENT_OTHER)
Admission: RE | Admit: 2017-01-07 | Discharge: 2017-01-07 | Disposition: A | Discharge status: Home / Self Care | Payer: Medicare Other | Source: Ambulatory Visit | Attending: Internal Medicine | Admitting: Internal Medicine

## 2017-01-07 ENCOUNTER — Encounter (HOSPITAL_COMMUNITY): Payer: Self-pay

## 2017-01-07 ENCOUNTER — Inpatient Hospital Stay (HOSPITAL_COMMUNITY)
Admission: AD | Admit: 2017-01-07 | Discharge: 2017-01-21 | DRG: 286 | Disposition: A | Discharge status: Home Health | Payer: Medicare Other | Source: Ambulatory Visit | Attending: Internal Medicine | Admitting: Internal Medicine

## 2017-01-07 VITALS — BP 115/69 | HR 81 | Wt 255.5 lb

## 2017-01-07 DIAGNOSIS — Z794 Long term (current) use of insulin: Secondary | ICD-10-CM

## 2017-01-07 DIAGNOSIS — J9610 Chronic respiratory failure, unspecified whether with hypoxia or hypercapnia: Secondary | ICD-10-CM

## 2017-01-07 DIAGNOSIS — S81801A Unspecified open wound, right lower leg, initial encounter: Secondary | ICD-10-CM

## 2017-01-07 DIAGNOSIS — Z9981 Dependence on supplemental oxygen: Secondary | ICD-10-CM

## 2017-01-07 DIAGNOSIS — I5023 Acute on chronic systolic (congestive) heart failure: Secondary | ICD-10-CM

## 2017-01-07 DIAGNOSIS — Z7982 Long term (current) use of aspirin: Secondary | ICD-10-CM

## 2017-01-07 DIAGNOSIS — R0600 Dyspnea, unspecified: Secondary | ICD-10-CM

## 2017-01-07 DIAGNOSIS — I509 Heart failure, unspecified: Secondary | ICD-10-CM | POA: Diagnosis not present

## 2017-01-07 DIAGNOSIS — I5022 Chronic systolic (congestive) heart failure: Secondary | ICD-10-CM | POA: Insufficient documentation

## 2017-01-07 DIAGNOSIS — E1122 Type 2 diabetes mellitus with diabetic chronic kidney disease: Secondary | ICD-10-CM

## 2017-01-07 DIAGNOSIS — L97811 Non-pressure chronic ulcer of other part of right lower leg limited to breakdown of skin: Secondary | ICD-10-CM | POA: Diagnosis present

## 2017-01-07 DIAGNOSIS — M109 Gout, unspecified: Secondary | ICD-10-CM | POA: Diagnosis present

## 2017-01-07 DIAGNOSIS — I2729 Other secondary pulmonary hypertension: Secondary | ICD-10-CM | POA: Diagnosis present

## 2017-01-07 DIAGNOSIS — I2510 Atherosclerotic heart disease of native coronary artery without angina pectoris: Secondary | ICD-10-CM | POA: Diagnosis not present

## 2017-01-07 DIAGNOSIS — K219 Gastro-esophageal reflux disease without esophagitis: Secondary | ICD-10-CM | POA: Diagnosis present

## 2017-01-07 DIAGNOSIS — N179 Acute kidney failure, unspecified: Secondary | ICD-10-CM | POA: Diagnosis present

## 2017-01-07 DIAGNOSIS — I482 Chronic atrial fibrillation, unspecified: Secondary | ICD-10-CM | POA: Diagnosis present

## 2017-01-07 DIAGNOSIS — I130 Hypertensive heart and chronic kidney disease with heart failure and stage 1 through stage 4 chronic kidney disease, or unspecified chronic kidney disease: Secondary | ICD-10-CM

## 2017-01-07 DIAGNOSIS — I472 Ventricular tachycardia: Secondary | ICD-10-CM | POA: Diagnosis not present

## 2017-01-07 DIAGNOSIS — E785 Hyperlipidemia, unspecified: Secondary | ICD-10-CM | POA: Diagnosis present

## 2017-01-07 DIAGNOSIS — Z951 Presence of aortocoronary bypass graft: Secondary | ICD-10-CM

## 2017-01-07 DIAGNOSIS — I872 Venous insufficiency (chronic) (peripheral): Secondary | ICD-10-CM | POA: Diagnosis present

## 2017-01-07 DIAGNOSIS — G4733 Obstructive sleep apnea (adult) (pediatric): Secondary | ICD-10-CM | POA: Insufficient documentation

## 2017-01-07 DIAGNOSIS — I255 Ischemic cardiomyopathy: Secondary | ICD-10-CM | POA: Diagnosis present

## 2017-01-07 DIAGNOSIS — S81802A Unspecified open wound, left lower leg, initial encounter: Secondary | ICD-10-CM | POA: Diagnosis not present

## 2017-01-07 DIAGNOSIS — E876 Hypokalemia: Secondary | ICD-10-CM | POA: Diagnosis present

## 2017-01-07 DIAGNOSIS — N189 Chronic kidney disease, unspecified: Secondary | ICD-10-CM

## 2017-01-07 DIAGNOSIS — N183 Chronic kidney disease, stage 3 unspecified: Secondary | ICD-10-CM

## 2017-01-07 DIAGNOSIS — J9622 Acute and chronic respiratory failure with hypercapnia: Secondary | ICD-10-CM | POA: Diagnosis present

## 2017-01-07 DIAGNOSIS — Z7901 Long term (current) use of anticoagulants: Secondary | ICD-10-CM

## 2017-01-07 DIAGNOSIS — R0602 Shortness of breath: Secondary | ICD-10-CM | POA: Diagnosis present

## 2017-01-07 DIAGNOSIS — L97821 Non-pressure chronic ulcer of other part of left lower leg limited to breakdown of skin: Secondary | ICD-10-CM | POA: Diagnosis present

## 2017-01-07 DIAGNOSIS — I5082 Biventricular heart failure: Secondary | ICD-10-CM | POA: Diagnosis present

## 2017-01-07 DIAGNOSIS — Z8711 Personal history of peptic ulcer disease: Secondary | ICD-10-CM

## 2017-01-07 DIAGNOSIS — J449 Chronic obstructive pulmonary disease, unspecified: Secondary | ICD-10-CM | POA: Diagnosis present

## 2017-01-07 DIAGNOSIS — Z79899 Other long term (current) drug therapy: Secondary | ICD-10-CM

## 2017-01-07 DIAGNOSIS — Z8673 Personal history of transient ischemic attack (TIA), and cerebral infarction without residual deficits: Secondary | ICD-10-CM

## 2017-01-07 DIAGNOSIS — D509 Iron deficiency anemia, unspecified: Secondary | ICD-10-CM | POA: Diagnosis present

## 2017-01-07 DIAGNOSIS — Z6839 Body mass index (BMI) 39.0-39.9, adult: Secondary | ICD-10-CM

## 2017-01-07 DIAGNOSIS — Z885 Allergy status to narcotic agent status: Secondary | ICD-10-CM

## 2017-01-07 DIAGNOSIS — Z91040 Latex allergy status: Secondary | ICD-10-CM

## 2017-01-07 LAB — COMPREHENSIVE METABOLIC PANEL
ALBUMIN: 3.2 g/dL — AB (ref 3.5–5.0)
ALK PHOS: 74 U/L (ref 38–126)
ALT: 15 U/L — AB (ref 17–63)
AST: 26 U/L (ref 15–41)
Anion gap: 11 (ref 5–15)
BILIRUBIN TOTAL: 1.5 mg/dL — AB (ref 0.3–1.2)
BUN: 45 mg/dL — ABNORMAL HIGH (ref 6–20)
CALCIUM: 9.5 mg/dL (ref 8.9–10.3)
CO2: 29 mmol/L (ref 22–32)
CREATININE: 1.29 mg/dL — AB (ref 0.61–1.24)
Chloride: 99 mmol/L — ABNORMAL LOW (ref 101–111)
GFR calc non Af Amer: 57 mL/min — ABNORMAL LOW (ref >60)
GLUCOSE: 91 mg/dL (ref 65–99)
Potassium: 3 mmol/L — ABNORMAL LOW (ref 3.5–5.1)
SODIUM: 139 mmol/L (ref 135–145)
TOTAL PROTEIN: 7.5 g/dL (ref 6.5–8.1)

## 2017-01-07 LAB — PROTIME-INR
INR: 3.06
Prothrombin Time: 32.3 seconds — ABNORMAL HIGH (ref 11.4–15.2)

## 2017-01-07 LAB — CBC WITH DIFFERENTIAL/PLATELET
Basophils Absolute: 0 10*3/uL (ref 0.0–0.1)
Basophils Relative: 0 %
EOS PCT: 3 %
Eosinophils Absolute: 0.3 10*3/uL (ref 0.0–0.7)
HEMATOCRIT: 42 % (ref 39.0–52.0)
Hemoglobin: 12.9 g/dL — ABNORMAL LOW (ref 13.0–17.0)
LYMPHS ABS: 1.6 10*3/uL (ref 0.7–4.0)
Lymphocytes Relative: 18 %
MCH: 25.1 pg — AB (ref 26.0–34.0)
MCHC: 30.7 g/dL (ref 30.0–36.0)
MCV: 81.7 fL (ref 78.0–100.0)
MONO ABS: 1 10*3/uL (ref 0.1–1.0)
Monocytes Relative: 11 %
NEUTROS ABS: 6.2 10*3/uL (ref 1.7–7.7)
Neutrophils Relative %: 68 %
PLATELETS: 154 10*3/uL (ref 150–400)
RBC: 5.14 MIL/uL (ref 4.22–5.81)
RDW: 19.8 % — AB (ref 11.5–15.5)
WBC: 9.1 10*3/uL (ref 4.0–10.5)

## 2017-01-07 LAB — MAGNESIUM: Magnesium: 1.7 mg/dL (ref 1.7–2.4)

## 2017-01-07 LAB — BRAIN NATRIURETIC PEPTIDE: B Natriuretic Peptide: 695.6 pg/mL — ABNORMAL HIGH (ref 0.0–100.0)

## 2017-01-07 LAB — TROPONIN I
Troponin I: 0.09 ng/mL (ref <0.03)
Troponin I: 0.06 ng/mL (ref <0.03)

## 2017-01-07 LAB — GLUCOSE, CAPILLARY
GLUCOSE-CAPILLARY: 187 mg/dL — AB (ref 65–99)
GLUCOSE-CAPILLARY: 106 mg/dL — AB (ref 65–99)

## 2017-01-07 LAB — TSH: TSH: 0.518 u[IU]/mL (ref 0.350–4.500)

## 2017-01-07 MED ORDER — ATORVASTATIN CALCIUM 20 MG PO TABS
20.0000 mg | ORAL_TABLET | Freq: Every day | ORAL | Status: DC
  Administered 2017-01-07 – 2017-01-20 (×14): 20 mg via ORAL
  Filled 2017-01-07 (×14): qty 1

## 2017-01-07 MED ORDER — ONDANSETRON HCL 4 MG/2ML IJ SOLN
4.0000 mg | Freq: Four times a day (QID) | INTRAMUSCULAR | Status: DC | PRN
  Administered 2017-01-14: 4 mg via INTRAVENOUS
  Filled 2017-01-07: qty 2

## 2017-01-07 MED ORDER — INSULIN ASPART 100 UNIT/ML ~~LOC~~ SOLN
15.0000 [IU] | Freq: Three times a day (TID) | SUBCUTANEOUS | Status: DC
  Administered 2017-01-07 – 2017-01-08 (×2): 15 [IU] via SUBCUTANEOUS
  Administered 2017-01-08: 10 [IU] via SUBCUTANEOUS
  Administered 2017-01-09 – 2017-01-21 (×29): 15 [IU] via SUBCUTANEOUS

## 2017-01-07 MED ORDER — SODIUM CHLORIDE 0.9% FLUSH: 3.0000 mL | INTRAVENOUS | Status: DC | PRN

## 2017-01-07 MED ORDER — POTASSIUM CHLORIDE CRYS ER 20 MEQ PO TBCR
40.0000 meq | EXTENDED_RELEASE_TABLET | Freq: Once | ORAL | Status: AC
  Administered 2017-01-07: 40 meq via ORAL
  Filled 2017-01-07: qty 2

## 2017-01-07 MED ORDER — PANTOPRAZOLE SODIUM 40 MG PO TBEC
40.0000 mg | DELAYED_RELEASE_TABLET | Freq: Two times a day (BID) | ORAL | Status: DC
  Administered 2017-01-07 – 2017-01-21 (×27): 40 mg via ORAL
  Filled 2017-01-07 (×27): qty 1

## 2017-01-07 MED ORDER — ACETAMINOPHEN 325 MG PO TABS
650.0000 mg | ORAL_TABLET | ORAL | Status: DC | PRN
  Administered 2017-01-07 – 2017-01-21 (×29): 650 mg via ORAL
  Filled 2017-01-07 (×29): qty 2

## 2017-01-07 MED ORDER — INSULIN GLARGINE 100 UNIT/ML ~~LOC~~ SOLN
25.0000 [IU] | Freq: Every day | SUBCUTANEOUS | Status: DC
  Filled 2017-01-07: qty 0.25

## 2017-01-07 MED ORDER — CALCITRIOL 0.25 MCG PO CAPS
0.2500 ug | ORAL_CAPSULE | Freq: Every day | ORAL | Status: DC
  Administered 2017-01-07 – 2017-01-21 (×14): 0.25 ug via ORAL
  Filled 2017-01-07 (×14): qty 1

## 2017-01-07 MED ORDER — INSULIN ASPART 100 UNIT/ML ~~LOC~~ SOLN
0.0000 [IU] | Freq: Three times a day (TID) | SUBCUTANEOUS | Status: DC
  Administered 2017-01-07: 4 [IU] via SUBCUTANEOUS
  Administered 2017-01-09: 10 [IU] via SUBCUTANEOUS
  Administered 2017-01-10: 4 [IU] via SUBCUTANEOUS
  Administered 2017-01-11: 7 [IU] via SUBCUTANEOUS
  Administered 2017-01-12: 3 [IU] via SUBCUTANEOUS
  Administered 2017-01-12: 4 [IU] via SUBCUTANEOUS
  Administered 2017-01-12: 3 [IU] via SUBCUTANEOUS
  Administered 2017-01-13: 7 [IU] via SUBCUTANEOUS
  Administered 2017-01-14 – 2017-01-15 (×2): 3 [IU] via SUBCUTANEOUS
  Administered 2017-01-15 – 2017-01-16 (×2): 4 [IU] via SUBCUTANEOUS
  Administered 2017-01-16: 7 [IU] via SUBCUTANEOUS
  Administered 2017-01-17: 3 [IU] via SUBCUTANEOUS
  Administered 2017-01-17: 4 [IU] via SUBCUTANEOUS
  Administered 2017-01-17 – 2017-01-18 (×2): 3 [IU] via SUBCUTANEOUS
  Administered 2017-01-18: 4 [IU] via SUBCUTANEOUS
  Administered 2017-01-18 – 2017-01-19 (×3): 3 [IU] via SUBCUTANEOUS
  Administered 2017-01-19: 7 [IU] via SUBCUTANEOUS
  Administered 2017-01-20: 3 [IU] via SUBCUTANEOUS
  Administered 2017-01-20: 7 [IU] via SUBCUTANEOUS
  Administered 2017-01-21: 4 [IU] via SUBCUTANEOUS
  Administered 2017-01-21: 5 [IU] via SUBCUTANEOUS

## 2017-01-07 MED ORDER — WARFARIN - PHARMACIST DOSING INPATIENT
Freq: Every day | Status: DC
  Administered 2017-01-07 – 2017-01-16 (×7)

## 2017-01-07 MED ORDER — ALLOPURINOL 100 MG PO TABS
100.0000 mg | ORAL_TABLET | Freq: Every day | ORAL | Status: DC
  Administered 2017-01-07 – 2017-01-21 (×14): 100 mg via ORAL
  Filled 2017-01-07 (×14): qty 1

## 2017-01-07 MED ORDER — SODIUM CHLORIDE 0.9% FLUSH
3.0000 mL | Freq: Two times a day (BID) | INTRAVENOUS | Status: DC
  Administered 2017-01-07 – 2017-01-19 (×19): 3 mL via INTRAVENOUS

## 2017-01-07 MED ORDER — GLIPIZIDE 5 MG PO TABS
5.0000 mg | ORAL_TABLET | Freq: Two times a day (BID) | ORAL | Status: DC
  Administered 2017-01-07 – 2017-01-21 (×27): 5 mg via ORAL
  Filled 2017-01-07 (×27): qty 1

## 2017-01-07 MED ORDER — MAGNESIUM SULFATE 2 GM/50ML IV SOLN
2.0000 g | Freq: Once | INTRAVENOUS | Status: AC
  Administered 2017-01-07: 2 g via INTRAVENOUS
  Filled 2017-01-07: qty 50

## 2017-01-07 MED ORDER — ASPIRIN EC 81 MG PO TBEC
81.0000 mg | DELAYED_RELEASE_TABLET | Freq: Every day | ORAL | Status: DC
  Administered 2017-01-07 – 2017-01-21 (×14): 81 mg via ORAL
  Filled 2017-01-07 (×16): qty 1

## 2017-01-07 MED ORDER — WARFARIN SODIUM 1 MG PO TABS
1.0000 mg | ORAL_TABLET | Freq: Once | ORAL | Status: AC
  Administered 2017-01-07: 1 mg via ORAL
  Filled 2017-01-07: qty 1

## 2017-01-07 MED ORDER — SODIUM CHLORIDE 0.9 % IV SOLN: 250.0000 mL | INTRAVENOUS | Status: DC | PRN

## 2017-01-07 MED ORDER — INSULIN LISPRO 100 UNIT/ML ~~LOC~~ SOLN: 15.0000 [IU] | Freq: Three times a day (TID) | SUBCUTANEOUS | Status: DC

## 2017-01-07 MED ORDER — FUROSEMIDE 10 MG/ML IJ SOLN
80.0000 mg | Freq: Two times a day (BID) | INTRAMUSCULAR | Status: DC
  Administered 2017-01-07 – 2017-01-13 (×13): 80 mg via INTRAVENOUS
  Filled 2017-01-07 (×13): qty 8

## 2017-01-07 MED ORDER — POTASSIUM CHLORIDE CRYS ER 20 MEQ PO TBCR
40.0000 meq | EXTENDED_RELEASE_TABLET | Freq: Two times a day (BID) | ORAL | Status: DC
  Administered 2017-01-07: 40 meq via ORAL
  Filled 2017-01-07: qty 2

## 2017-01-07 NOTE — Consult Note (Addendum)
WOC Nurse wound consult note Reason for Consult: Consult requested for bilat legs.  Pt with generalized edema and patchy areas of blisters which have ruptured and evolved into partial thickness skin loss. Left calf with several patchy areas of partial thickness wounds; red and moist, large amt yellow drainage, no odor Right leg with posterior partial thickness wound 2X4X.1cm, anterior calf 4X6X.1cm, red and moist, large amt yellow drainage, no odor Order/placement/frequency: Bedside nurses can change dressings Q day as follows: Xeroform gauze to promote drying and healing, ABD pads to absorb drainage, ace wrap for light compression.  Discussed plan of care with pt and he verbalized understanding. Please re-consult if further assistance is needed.  Thank-you,  Cammie Mcgee MSN, RN, CWOCN, Violet, CNS [phone]

## 2017-01-07 NOTE — Progress Notes (Signed)
ANTICOAGULATION CONSULT NOTE - Initial Consult  Pharmacy Consult for warfarin  Indication: atrial fibrillation  Allergies  Allergen Reactions  . Latex     Rash   . Oxycodone     hallucinations    Patient Measurements: Height:  (175.3 cm) Weight: 255 lb 1.3 oz (115.7 kg) IBW/kg (Calculated) : 70.7  Vital Signs: BP: 118/86 (04/19 1253) Pulse Rate: 90 (04/19 1253)  Labs:  Recent Labs  01/07/17 1421  HGB 12.9*  HCT 42.0  PLT 154  LABPROT 32.3*  INR 3.06    CrCl cannot be calculated (Patient's most recent lab result is older than the maximum 21 days allowed.).   Medical History: Past Medical History:  Diagnosis Date  . Atrial fibrillation, permanent (HCC)   . Blood in stool   . Chronic systolic congestive heart failure, NYHA class 3 (HCC)   . Diabetes (HCC)   . Edema   . Esophageal reflux   . Gout   . HLD (hyperlipidemia)   . HTN (hypertension)   . Iron deficiency anemia 10/02/2016  . Ischemic cardiomyopathy   . Migraine aura, persistent   . Morbid obesity with BMI of 40.0-44.9, adult (HCC)   . OSA (obstructive sleep apnea)   . Peptic ulcer   . RBBB LAFB   . Venous insufficiency   Assessment:  66 year old with a history of obesity, CAD with CABG in 2006 at [facility] complicated by sternal dehiscence, chronic A fib, DMII, CVA, CKD, COPD on home O2, gout, OSA, RBBB, and ICM.  Patient presented to HF clinic with volume overload being admitted to hospital. He is on chronic coumadin for his afib. INR 3.0 this afternoon. D/w HF team, no procedures planned at this time, will give small dose tonight, as he may have increased sensitivity d/t volume and hepatic congestion.   Goal of Therapy:  INR 2-3 Monitor platelets by anticoagulation protocol: Yes   Plan:  Warfarin  tonight Daily INR for now  Sheppard Coil PharmD., BCPS Clinical Pharmacist Pager [phone] 01/07/2017 4:10 PM

## 2017-01-07 NOTE — Progress Notes (Signed)
CRITICAL VALUE ALERT  Critical value received:  Troponin 0.09  Date of notification:  01/07/17  Time of notification:  1615  Critical value read back:Yes.    Nurse who received alert:  Tilman Neat  MD notified (1st page):  Amy Clegg  Time of first page:  1640  MD notified (2nd page):  Time of second page:  Responding MD:  Tonye Becket  Time MD responded: [phone]

## 2017-01-07 NOTE — Progress Notes (Signed)
ADVANCED HF CLINIC NOTE    Patient ID: Donald Dunlap, male   DOB: 1951/01/13, 66 y.o.   MRN: [id number] JYN:Donald Dunlap Primary Cardiologist: [facility] Referring: Donald Dunlap (EP)  HPI: Mr Donald Dunlap is a 66 year old male with chronic HF followed by Dr. Wynetta Dunlap who is referred back for further evaluation of worsening volume overload.   He has a history of obesity CAD with CABG in 2006 at [facility] complicated by sternal dehiscence,chronic atrial fib on coumadin, h/o multiple strokes, CKD (cr 1.7), COPD on home O2, DM2, gout, ICM, obesity, OSA, RBBB, esophageal reflux and venous insufficiency.   Last LHC 2006.   We last him about 2 years ago in clinic. He has been followed Dr. Wynetta Dunlap. Over past month or two has had worsening LE edema and SOB. Was hospitalized at [facility] in 2017 for volume overload. Diuresed 61 pounds. Weight on d/c 250-260. Weight now 255-260. SOB with any activity walks with cane. Legs hurt due to swelling. Sleeps in recliner due to orthopnea/PND. Dr. Wynetta Dunlap has been adjusting diuretics aggressively. Weight comes down a bit and then goes back. Trying to watch what he drinks and eats but is worried about "dehydrating." Eats a lot of ice. Taking lasix 60 bid. Taking metolazone 2.5 qod.    Echo  EF 25-30%. Mild MR.    RHC 8/17  RA 26 RV 84/28 PA 86/36 (64) Unable to get PCWP  PA sat 52%  Fick 4.2/1.7  Myoview 10/17   EF 29% No ischemia  Review of Systems:     Cardiac Review of Systems: {Y] = yes  = no  Chest Pain [    ]  Resting SOB Cove.Etienne   ] Exertional SOB  [ y ]  Orthopnea [  ]   Pedal Edema [  y ]    Palpitations [  ] Syncope  [  ]   Presyncope [   ]  General Review of Systems: [Y] = yes [  ]=no Constitional: recent weight change Cove.Etienne  ]; anorexia [  ]; fatigue Cove.Etienne  ]; nausea [  ]; night sweats [  ]; fever [  ];  chills [  ];  Eyes : blurred vision [  ]; diplopia [   ]; vision changes [  ];  Amaurosis fugax[  ]; Resp: cough Cove.Etienne  ];  wheezing[  ];   hemoptysis[  ];  PND Cove.Etienne  ];  GI:  gallstones[  ], vomiting[  ];  dysphagia[  ]; melena[  ];  hematochezia [  ]; heartburn[  ];   GU: kidney stones [  ]; hematuria[  ];   dysuria [  ];  nocturia[  ]; incontinence [  ];             Skin: rash, swelling[  ];, hair loss[  ];  peripheral edema[y  ];  or itching[  ]; Musculosketetal: myalgias[ y ];  joint swelling[  ];  joint erythema[  ];  joint pain[y  ];  back pain[  ];  Heme/Lymph: bruising[  ];  bleeding[  ];  anemia[  ];  Neuro: TIA[  ];  headaches[  ];  stroke[  ];  vertigo[  ];  seizures[  ];   paresthesias[  ];  difficulty walking[ y ];  Psych:depression[  ]; anxiety[  ];  Endocrine: diabetes[y  ];  thyroid dysfunction[  ];  Other:  Past Medical History:  Diagnosis Date  . Atrial fibrillation, permanent (HCC)   .  Blood in stool   . Chronic systolic congestive heart failure, NYHA class 3 (HCC)   . Diabetes (HCC)   . Edema   . Esophageal reflux   . Gout   . HLD (hyperlipidemia)   . HTN (hypertension)   . Iron deficiency anemia 10/02/2016  . Ischemic cardiomyopathy   . Migraine aura, persistent   . Morbid obesity with BMI of 40.0-44.9, adult (HCC)   . OSA (obstructive sleep apnea)   . Peptic ulcer   . RBBB LAFB   . Venous insufficiency      SH:  Social History   Social History  . Marital status: Married    Spouse name: N/A  . Number of children: N/A  . Years of education: N/A   Occupational History  . Not on file.   Social History Main Topics  . Smoking status: Never Smoker  . Smokeless tobacco: Never Used  . Alcohol use No  . Drug use: No  . Sexual activity: Not on file   Other Topics Concern  . Not on file   Social History Narrative  . No narrative on file    FH: No h/o familial cardiomyopathy or connective tissue disease   Current Outpatient Prescriptions  Medication Sig Dispense Refill  . allopurinol (ZYLOPRIM) 100 MG tablet Take 100 mg by mouth daily.    Marland Kitchen aspirin 81 MG EC tablet Take 81 mg by  mouth daily. Swallow whole.    Marland Kitchen atorvastatin (LIPITOR) 20 MG tablet Take 20 mg by mouth daily.    . calcitRIOL (ROCALTROL) 0.25 MCG capsule Take 0.25 mcg by mouth daily. Three times a week    . carvedilol (COREG) 6.25 MG tablet Take 6.25 mg by mouth 2 (two) times daily with a meal.    . diphenhydrAMINE (BENADRYL) 25 MG tablet Take 25 mg by mouth every 6 (six) hours as needed.    . furosemide (LASIX) 20 MG tablet Take 60 mg by mouth 2 (two) times daily.    Marland Kitchen glipiZIDE (GLUCOTROL) 5 MG tablet Take 5 mg by mouth 2 (two) times daily before a meal.     . insulin glargine (LANTUS) 100 UNIT/ML injection Inject 25 Units into the skin at bedtime.     . insulin lispro (HUMALOG) 100 UNIT/ML injection Inject 15 Units into the skin 3 (three) times daily before meals.    . metolazone (ZAROXOLYN) 2.5 MG tablet Take 2.5 mg by mouth every other day.    . pantoprazole (PROTONIX) 40 MG tablet Take 40 mg by mouth 2 (two) times daily.    Marland Kitchen warfarin (COUMADIN) 4 MG tablet Take 4 mg by mouth daily. 1/2 tab one day and then one full tab the next     No current facility-administered medications for this encounter.     Vitals:   01/07/17 1034  BP: 115/69  Pulse: 81  SpO2: 92%  Weight: 255 lb 8 oz (115.9 kg)   PHYSICAL EXAM: General:  Chronically ill appearing. Sitting in WC. NAD HEENT: normal anicteric Neck: supple. JVP ear Carotids 2+ bilaterally; no bruits. No lymphadenopathy or thryomegaly appreciated. Cor: PMI non-palpable . Irregular  rate & rhythm. No rubs, gallops or murmurs. No s3 Lungs: basilar crackles  Abdomen: morbidly obese, soft, nontender, ++ distended. No hepatosplenomegaly. No bruits or masses. Good BS Extremities: no cyanosis, clubbing, rash, R and LLE  3+ edema with erythema and bullae. Chronic venous stasis changes.  Neuro: alert & orientedx3, cranial nerves grossly intact. Moves all 4 extremities w/o difficulty.  Affect pleasant.  ECG AF 92 RBBB  ASSESSMENT & PLAN: 1. Acute on Chronic  Systolic HF .  --Echo, cath reports and Myoview reviewed peraonally.  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  --He is s/p Medtronic ICD --Now with marked volume overload and failing outpatient treatment  --Admit to tele. Diurese with IV lasix. Place UNNA boots. May need abx for cellulitis --May need repeat RHC pos diuresis 2. CAD --No ischemic sx. Recent Myoview without ischemia. Continue ASA, statis 3. Chronic A fib    --CHADSVasc - 7    --On coumadin. Follow INR 4. OSA    --Intolerant of CPAP 5. CKD:    -- Baseline creatinine seems to be 1.6-1.8. Follow with diuresis  6. Chronic respiratory failure.      --continue O2. Need weight loss 7. Venous statis ulcers with possible cellulitis     --UNNA boots. Low threshold for abx  Laporchia Nakajima,MD 11:13 AM

## 2017-01-07 NOTE — Progress Notes (Signed)
Patient direct admit from office to 2W38 A&Ox4, VSS. Tele applied and CCMD notified.

## 2017-01-07 NOTE — H&P (Addendum)
Advanced Heart Failure Team History and Physical Note   Primary Physician: Dr Tiburcio Pea  Primary Cardiologist:  Dr Wynetta Fines  Reason for Admission: A/C Systolic Heart Failure   HPI:   Donald Dunlap is a 66 year old with a history of obesity, CAD with CABG in 2006 at [facility] complicated by sternal dehiscence, chronic A fib, DMII, CVA, CKD, COPD on home O2, gout, OSA, RBBB, and ICM.  Last LHC 2006.   Hospitalized at [facility] 2017 with volume overload. Diuresed over 50 pounds. Discharge weight 260 pounds.   Prior to admit home diuretic regimen was adjusted but he continue to have ongoing issues with edema and weight gain. Eats lots of ice. Says he has been SOB with exertion. He also reports increased leg edema with blisters.   Earlier today he presented to the HF clinic with increased dyspnea and edema. Admitted with A/C systolic heart failure.   Cardiac Studies Echo 2016 hEF 25-30%. Mild Donald. RHC 8/17 RA 26 RV 84/28  PA 86/36 (64) Unable to get PCWP  PA sat 52%  Fick 4.2/1.7 Myoview 10/17  EF 29% No ischemia  Review of Systems: [y] = yes,  = no   General: Weight gain [Y ]; Weight loss ; Anorexia ; Fatigue [Y ]; Fever ; Chills ; Weakness   Cardiac: Chest pain/pressure ; Resting SOB [Y ]; Exertional SOB [Y]; Orthopnea [ Y]; Pedal Edema [Y ]; Palpitations ; Syncope ; Presyncope ; Paroxysmal nocturnal dyspnea[y ]  Pulmonary: Cough ; Wheezing[ ] ; Hemoptysis[ ] ; Sputum ; Snoring   GI: Vomiting[ ] ; Dysphagia[ ] ; Melena[ ] ; Hematochezia ; Heartburn[ ] ; Abdominal pain ; Constipation ; Diarrhea ; BRBPR   GU: Hematuria[ ] ; Dysuria ; Nocturia[ ]   Vascular: Pain in legs with walking Cove.Etienne ]; Pain in feet with lying flat ; Non-healing sores Cove.Etienne ]; Stroke [ Y]; TIA ; Slurred speech ;  Neuro: Headaches[ ] ; Vertigo[ ] ; Seizures[ ] ; Paresthesias[ ] ;Blurred vision ; Diplopia ; Vision changes   Ortho/Skin: Arthritis [ y]; Joint pain  [Y]; Muscle pain ; Joint swelling ; Back Pain [ Y]; Rash   Psych: Depression[ ] ; Anxiety[ ]   Heme: Bleeding problems ; Clotting disorders ; Anemia   Endocrine: Diabetes [ Y]; Thyroid dysfunction[ ]   Home Medications Prior to Admission medications   Medication Sig Start Date End Date Taking? Authorizing Provider  allopurinol (ZYLOPRIM) 100 MG tablet Take 100 mg by mouth daily.    Historical Provider, MD  aspirin 81 MG EC tablet Take 81 mg by mouth daily. Swallow whole.    Historical Provider, MD  atorvastatin (LIPITOR) 20 MG tablet Take 20 mg by mouth daily.    Historical Provider, MD  calcitRIOL (ROCALTROL) 0.25 MCG capsule Take 0.25 mcg by mouth daily. Three times a week    Historical Provider, MD  carvedilol (COREG) 6.25 MG tablet Take 6.25 mg by mouth 2 (two) times daily with a meal.    Historical Provider, MD  diphenhydrAMINE (BENADRYL) 25 MG tablet Take 25 mg by mouth every 6 (six) hours as needed.    Historical Provider, MD  furosemide (LASIX) 20 MG tablet Take 60 mg by mouth 2 (two) times daily.    Historical Provider, MD  glipiZIDE (GLUCOTROL) 5 MG tablet Take 5 mg by mouth 2 (two) times daily before a meal.  Historical Provider, MD  insulin glargine (LANTUS) 100 UNIT/ML injection Inject 25 Units into the skin at bedtime.     Historical Provider, MD  insulin lispro (HUMALOG) 100 UNIT/ML injection Inject 15 Units into the skin 3 (three) times daily before meals.    Historical Provider, MD  metolazone (ZAROXOLYN) 2.5 MG tablet Take 2.5 mg by mouth every other day.    Historical Provider, MD  pantoprazole (PROTONIX) 40 MG tablet Take 40 mg by mouth 2 (two) times daily.    Historical Provider, MD  warfarin (COUMADIN) 4 MG tablet Take 4 mg by mouth daily. 1/2 tab one day and then one full tab the next    Historical Provider, MD    Past Medical History: Past Medical History:  Diagnosis Date  . Atrial fibrillation, permanent (HCC)   . Blood in stool   . Chronic  systolic congestive heart failure, NYHA class 3 (HCC)   . Diabetes (HCC)   . Edema   . Esophageal reflux   . Gout   . HLD (hyperlipidemia)   . HTN (hypertension)   . Iron deficiency anemia 10/02/2016  . Ischemic cardiomyopathy   . Migraine aura, persistent   . Morbid obesity with BMI of 40.0-44.9, adult (HCC)   . OSA (obstructive sleep apnea)   . Peptic ulcer   . RBBB LAFB   . Venous insufficiency     Past Surgical History: Past Surgical History:  Procedure Laterality Date  . KNEE SURGERY     arthroscopic R & L  . left 3 toes    . right hand surgery    . SHOULDER SURGERY     bilateral  . VEIN BYPASS SURGERY      Family History: No family history of sudden cardiac death or coronary disease.   Social History: Social History   Social History  . Marital status: Married    Spouse name: N/A  . Number of children: N/A  . Years of education: N/A   Social History Main Topics  . Smoking status: Never Smoker  . Smokeless tobacco: Never Used  . Alcohol use No  . Drug use: No  . Sexual activity: Not Asked   Other Topics Concern  . None   Social History Narrative  . None    Allergies:  Allergies  Allergen Reactions  . Latex     Rash   . Oxycodone     hallucinations    Objective:    Vital Signs:   Temp:  [96.1 F (35.6 C)] 96.1 F (35.6 C) (04/19 2112) Pulse Rate:  [71-90] 71 (04/19 2112) Resp:  [18-19] 18 (04/19 2112) BP: (115-118)/(69-87) 117/87 (04/19 2112) SpO2:  [92 %-100 %] 100 % (04/19 2112) Weight:  [115.7 kg (255 lb 1.3 oz)-115.9 kg (255 lb 8 oz)] 115.7 kg (255 lb 1.3 oz) (04/19 1253) Last BM Date: 01/07/17 Filed Weights   01/07/17 1253  Weight: 115.7 kg (255 lb 1.3 oz)    Physical Exam: General:  Chronically ill appearing. Dyspneic at rest. HEENT: normal Neck: supple. JVP to ear. Carotids 2+ bilat; no bruits. No lymphadenopathy or thryomegaly appreciated. Cor: PMI nondisplaced. Regular rate & rhythm. No rubs, gallops or murmurs. Lungs:  Crackles in 2 liters nasal cannula.  Abdomen: soft, nontender, ++ distended. No hepatosplenomegaly. No bruits or masses. Good bowel sounds. Extremities: no cyanosis, clubbing, rash, R and LLE 3+ edema Large bulla noted on RLE and LLE.  Neuro: alert & orientedx3, cranial nerves grossly intact. moves all 4 extremities w/o  difficulty. Affect pleasant  Telemetry: A fib 70s with frequent PVCs. Personally reviewed    Labs: Basic Metabolic Panel:  Recent Labs Lab 01/07/17 1421  NA 139  K 3.0*  CL 99*  CO2 29  GLUCOSE 91  BUN 45*  CREATININE 1.29*  CALCIUM 9.5  MG 1.7    Liver Function Tests:  Recent Labs Lab 01/07/17 1421  AST 26  ALT 15*  ALKPHOS 74  BILITOT 1.5*  PROT 7.5  ALBUMIN 3.2*   No results for input(s): LIPASE, AMYLASE in the last 168 hours. No results for input(s): AMMONIA in the last 168 hours.  CBC:  Recent Labs Lab 01/07/17 1421  WBC 9.1  NEUTROABS 6.2  HGB 12.9*  HCT 42.0  MCV 81.7  PLT 154    Cardiac Enzymes:  Recent Labs Lab 01/07/17 1421 01/07/17 1912  TROPONINI 0.09* 0.06*    BNP: BNP (last 3 results)  Recent Labs  01/07/17 1421  BNP 695.6*    ProBNP (last 3 results) No results for input(s): PROBNP in the last 8760 hours.   CBG:  Recent Labs Lab 01/07/17 1732 01/07/17 2108  GLUCAP 187* 106*    Coagulation Studies:  Recent Labs  01/07/17 1421  LABPROT 32.3*  INR 3.06    Other results: EKG: pending Imaging: Dg Chest Port 1 View  Result Date: 01/07/2017 CLINICAL DATA:  Dyspnea. EXAM: PORTABLE CHEST 1 VIEW COMPARISON:  None. FINDINGS: Single lead left-sided pacemaker with tip overlying the right ventricle. Marked cardiomegaly. Moderate pulmonary edema. No large pleural effusion seen on single AP view. No evidence confluent airspace disease, soft tissue attenuation partially obscures evaluation of the left lung base. No pneumothorax. IMPRESSION: Findings consistent with CHF, cardiomegaly and pulmonary edema.  Electronically Signed   By: Rubye Oaks M.D.   On: 01/07/2017 20:35        Assessment/Plan Discussion    1. 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  Most recent ECHO 2016 EF 25-30%. Repeat. Marked volume overload. Start IV lasix 80 mg twice a day.  Cut back bb to carvedilol 3.125 mg twice a day.  Can adjust other medications after labs result With history CKD will hold off on arb. May be able to use spiro.  2. CAD  No CP. Recent myoview 2017  ok. Continue asa to statin 3. Chronic A fib- Rate controlled. On coumadin. Pharmacy to dose.  4. CKD Stage III- Creatinine baseline 1.6-1.8 5. OSA- intolerant CPAP 6. Chronic Respiratory Failure - on  Home oxygnem  7. R and LLE partial thickness wounds- Could consider unna boot but there is concern for cellulitis. IF WBC elevate will add antibiotics. WOC consult.  8. Hypokalemia K 3.0 Supplement K   Check labs now, INR, CMET, CBC, BNP, Troponin, and TSH now. CXR    Length of Stay: 0  Amy Clegg NP-C Advanced Heart Failure Team Pager [phone] (M-F; 7a - 4p)  Please contact [facility] for night-coverage after hours (4p -7a ) and weekends on [email or web address]  Patient seen and examined with Tonye Becket, NP. We discussed all aspects of the encounter. I agree with the assessment and plan as stated above.   He is markedly volume overloaded with biventricular failure. He has failed outpatient diuresis. Will admit for IV diuresis. Wrap legs. WOC to see. Watch renal function closely.   Arvilla Meres, MD  10:28 PM

## 2017-01-08 ENCOUNTER — Inpatient Hospital Stay (HOSPITAL_COMMUNITY): Payer: Medicare Other

## 2017-01-08 DIAGNOSIS — S81802A Unspecified open wound, left lower leg, initial encounter: Secondary | ICD-10-CM

## 2017-01-08 DIAGNOSIS — I509 Heart failure, unspecified: Secondary | ICD-10-CM

## 2017-01-08 LAB — GLUCOSE, CAPILLARY
GLUCOSE-CAPILLARY: 127 mg/dL — AB (ref 65–99)
GLUCOSE-CAPILLARY: 142 mg/dL — AB (ref 65–99)
Glucose-Capillary: 189 mg/dL — ABNORMAL HIGH (ref 65–99)
Glucose-Capillary: 185 mg/dL — ABNORMAL HIGH (ref 65–99)

## 2017-01-08 LAB — BASIC METABOLIC PANEL
Anion gap: 10 (ref 5–15)
BUN: 45 mg/dL — AB (ref 6–20)
CALCIUM: 9.8 mg/dL (ref 8.9–10.3)
CO2: 32 mmol/L (ref 22–32)
CREATININE: 1.44 mg/dL — AB (ref 0.61–1.24)
Chloride: 97 mmol/L — ABNORMAL LOW (ref 101–111)
GFR calc Af Amer: 57 mL/min — ABNORMAL LOW (ref >60)
GFR, EST NON AFRICAN AMERICAN: 50 mL/min — AB (ref >60)
Glucose, Bld: 121 mg/dL — ABNORMAL HIGH (ref 65–99)
POTASSIUM: 3.3 mmol/L — AB (ref 3.5–5.1)
SODIUM: 139 mmol/L (ref 135–145)

## 2017-01-08 LAB — PROTIME-INR
INR: 3.18
PROTHROMBIN TIME: 33.3 s — AB (ref 11.4–15.2)

## 2017-01-08 LAB — TROPONIN I: Troponin I: 0.08 ng/mL (ref <0.03)

## 2017-01-08 LAB — ECHOCARDIOGRAM COMPLETE
Height: 69 in
Weight: 4243.2 oz

## 2017-01-08 LAB — MAGNESIUM: Magnesium: 2.1 mg/dL (ref 1.7–2.4)

## 2017-01-08 MED ORDER — WARFARIN SODIUM 1 MG PO TABS
0.5000 mg | ORAL_TABLET | Freq: Once | ORAL | Status: AC
  Administered 2017-01-08: 0.5 mg via ORAL
  Filled 2017-01-08: qty 1

## 2017-01-08 MED ORDER — POTASSIUM CHLORIDE CRYS ER 20 MEQ PO TBCR
40.0000 meq | EXTENDED_RELEASE_TABLET | Freq: Three times a day (TID) | ORAL | Status: DC
  Administered 2017-01-08 – 2017-01-11 (×12): 40 meq via ORAL
  Filled 2017-01-08 (×12): qty 2

## 2017-01-08 MED ORDER — CARVEDILOL 3.125 MG PO TABS
3.1250 mg | ORAL_TABLET | Freq: Two times a day (BID) | ORAL | Status: DC
  Administered 2017-01-08 – 2017-01-21 (×25): 3.125 mg via ORAL
  Filled 2017-01-08 (×25): qty 1

## 2017-01-08 MED ORDER — PERFLUTREN LIPID MICROSPHERE
1.0000 mL | INTRAVENOUS | Status: AC | PRN
  Administered 2017-01-08: 2 mL via INTRAVENOUS
  Filled 2017-01-08: qty 10

## 2017-01-08 MED ORDER — METOLAZONE 2.5 MG PO TABS
2.5000 mg | ORAL_TABLET | Freq: Once | ORAL | Status: AC
  Administered 2017-01-08: 2.5 mg via ORAL
  Filled 2017-01-08: qty 1

## 2017-01-08 MED ORDER — SPIRONOLACTONE 25 MG PO TABS
12.5000 mg | ORAL_TABLET | Freq: Every day | ORAL | Status: DC
  Administered 2017-01-08 – 2017-01-10 (×3): 12.5 mg via ORAL
  Filled 2017-01-08 (×3): qty 1

## 2017-01-08 MED ORDER — INSULIN GLARGINE 100 UNIT/ML ~~LOC~~ SOLN
25.0000 [IU] | Freq: Every day | SUBCUTANEOUS | Status: DC
  Administered 2017-01-08 – 2017-01-21 (×13): 25 [IU] via SUBCUTANEOUS
  Filled 2017-01-08 (×15): qty 0.25

## 2017-01-08 NOTE — Progress Notes (Signed)
01/08/17 0840  Patient is aware that he needs to use the urinal so we can keep up with his urine output. Patient agreed to use urinal. Urinal place on bed rail for patient to use.

## 2017-01-08 NOTE — Progress Notes (Signed)
ANTICOAGULATION CONSULT NOTE - Follow Up Consult  Pharmacy Consult for Coumadin Indication: atrial fibrillation  Allergies  Allergen Reactions  . Latex     Rash   . Oxycodone     hallucinations    Patient Measurements: Height:  (175.3 cm) Weight: 265 lb 3.2 oz (120.3 kg) IBW/kg (Calculated) : 70.7  Vital Signs: BP: 116/89 (04/20 0545) Pulse Rate: 79 (04/20 0545)  Labs:  Recent Labs  01/07/17 1421 01/07/17 1912 01/08/17 0126  HGB 12.9*  --   --   HCT 42.0  --   --   PLT 154  --   --   LABPROT 32.3*  --  33.3*  INR 3.06  --  3.18  CREATININE 1.29*  --  1.44*  TROPONINI 0.09* 0.06* 0.08*    Estimated Creatinine Clearance: 65.5 mL/min (A) (by C-G formula based on SCr of 1.44 mg/dL (H)).  Assessment: 65yom continues on coumadin for afib. INR slightly above goal at 3.1. No bleeding.  PTA dose:  daily except  Mon/Fri  Goal of Therapy:  INR 2-3  Monitor platelets by anticoagulation protocol: Yes   Plan:  1) Coumadin 0.5mg  tonight 2) Daily INR  Fredrik Rigger 01/08/2017,11:23 AM

## 2017-01-08 NOTE — Care Management Note (Signed)
Case Management Note Donald Pierini RN, BSN Unit 2W-Case Manager [phone]  Patient Details  Name: Donald Dunlap MRN: [id number] Date of Birth: [birth year]  Subjective/Objective:  Pt admitted with acute on chronic heart failure                  Action/Plan: PTA pt lived at home with wife, independent- pt followed by the HF clinic- CM will follow for any d/c needs- anticipate return home.   Expected Discharge Date:                  Expected Discharge Plan:  Home w Home Health Services  In-House Referral:     Discharge planning Services  CM Consult  Post Acute Care Choice:    Choice offered to:     DME Arranged:    DME Agency:     HH Arranged:    HH Agency:     Status of Service:  In process, will continue to follow  If discussed at Long Length of Stay Meetings, dates discussed:    Additional Comments:  Darrold Span, RN 01/08/2017, 10:35 AM

## 2017-01-08 NOTE — Progress Notes (Signed)
Advanced Heart Failure Rounding Note   Subjective:    Admitted from HF clinic with massive volume overload. Started on 80 mg IV lasix. Urine output picking up. Weight inaccurate. Feeling a bit better but still SOB with exertion.   Objective:   Weight Range:  Vital Signs:   Temp:  [96.1 F (35.6 C)] 96.1 F (35.6 C) (04/19 2112) Pulse Rate:  [71-90] 79 (04/20 0545) Resp:  [18-19] 18 (04/20 0545) BP: (116-118)/(86-89) 116/89 (04/20 0545) SpO2:  [99 %-100 %] 99 % (04/20 0545) Weight:  [255 lb 1.3 oz (115.7 kg)-265 lb 4.8 oz (120.3 kg)] 265 lb 4.8 oz (120.3 kg) (04/20 0545) Last BM Date: 01/07/17  Weight change: Filed Weights   01/07/17 1253 01/08/17 0545  Weight: 255 lb 1.3 oz (115.7 kg) 265 lb 4.8 oz (120.3 kg)    Intake/Output:  No intake or output data in the 24 hours ending 01/08/17 0748   Physical Exam: General:  Appears chronically ill. Lying in bed.  No resp difficulty HEENT: normal Neck: supple. JVP to jaw.  Carotids 2+ bilat; no bruits. No lymphadenopathy or thryomegaly appreciated. Cor: PMI nondisplaced. Irregular rate & rhythm. No rubs, gallops or murmurs. Lungs: Crackles in bases on 2 liters oxygen  Abdomen: obese, soft, nontender, + distended. No hepatosplenomegaly. No bruits or masses. Good bowel sounds. Extremities: no cyanosis, clubbing, rash, R and LLE ace wraps. 2+ edema Neuro: alert & orientedx3, cranial nerves grossly intact. moves all 4 extremities w/o difficulty. Affect pleasant  Telemetry: A fib with PVCs 70s Personally reviewed   Labs: Basic Metabolic Panel:  Recent Labs Lab 01/07/17 1421 01/08/17 0126  NA 139 139  K 3.0* 3.3*  CL 99* 97*  CO2 29 32  GLUCOSE 91 121*  BUN 45* 45*  CREATININE 1.29* 1.44*  CALCIUM 9.5 9.8  MG 1.7 2.1    Liver Function Tests:  Recent Labs Lab 01/07/17 1421  AST 26  ALT 15*  ALKPHOS 74  BILITOT 1.5*  PROT 7.5  ALBUMIN 3.2*   No results for input(s): LIPASE, AMYLASE in the last 168  hours. No results for input(s): AMMONIA in the last 168 hours.  CBC:  Recent Labs Lab 01/07/17 1421  WBC 9.1  NEUTROABS 6.2  HGB 12.9*  HCT 42.0  MCV 81.7  PLT 154    Cardiac Enzymes:  Recent Labs Lab 01/07/17 1421 01/07/17 1912 01/08/17 0126  TROPONINI 0.09* 0.06* 0.08*    BNP: BNP (last 3 results)  Recent Labs  01/07/17 1421  BNP 695.6*    ProBNP (last 3 results) No results for input(s): PROBNP in the last 8760 hours.    Other results:  Imaging: Dg Chest Port 1 View  Result Date: 01/07/2017 CLINICAL DATA:  Dyspnea. EXAM: PORTABLE CHEST 1 VIEW COMPARISON:  None. FINDINGS: Single lead left-sided pacemaker with tip overlying the right ventricle. Marked cardiomegaly. Moderate pulmonary edema. No large pleural effusion seen on single AP view. No evidence confluent airspace disease, soft tissue attenuation partially obscures evaluation of the left lung base. No pneumothorax. IMPRESSION: Findings consistent with CHF, cardiomegaly and pulmonary edema. Electronically Signed   By: Rubye Oaks M.D.   On: 01/07/2017 20:35      Medications:     Scheduled Medications: . allopurinol  100 mg Oral Daily  . aspirin EC  81 mg Oral Daily  . atorvastatin  20 mg Oral q1800  . calcitRIOL  0.25 mcg Oral Daily  . furosemide  80 mg Intravenous BID  . glipiZIDE  5 mg Oral BID AC  . insulin aspart  0-20 Units Subcutaneous TID WC  . insulin aspart  15 Units Subcutaneous TID WC  . insulin glargine  25 Units Subcutaneous QHS  . pantoprazole  40 mg Oral BID WC  . potassium chloride  40 mEq Oral BID  . sodium chloride flush  3 mL Intravenous Q12H  . Warfarin - Pharmacist Dosing Inpatient   Does not apply q1800     Infusions: . sodium chloride       PRN Medications:  sodium chloride, acetaminophen, ondansetron (ZOFRAN) IV, sodium chloride flush   Assessment/Plan/Discussion   1. 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  Most recent ECHO 2016 EF 25-30%. Repeat  ECHO pending.  Marked volume overload.  Continue 80 mg IV lasix twice a day + 2.5 mg metolazone.   Continue lower dose of carvedilol. Add 12.5 mg spironolactone.   Can adjust other medications after labs result With history CKD will hold off on arb. May be able to use spiro.  2. CAD  No CP.  Recent myoview 2017  ok. Continue asa to statin 3. Chronic A fib- Rate controlled. INR 3.18 Pharmacy to dose coumadin. .  4. CKD Stage III- Creatinine baseline 1.6-1.8. Todays Creatinine 1.4.  5. OSA- intolerant CPAP 6. Chronic Respiratory Failure - on  Home oxygnem  7. R and LLE partial thickness wounds- Could consider unna boot but there is concern for cellulitis. WBC ok. WOC appreciated. Continue conservative wound care + ace for mild compression.  8. Hypokalemia: K 3.3 Supplement K.     Length of Stay: 1  Amy Clegg NP-C  01/08/2017, 7:48 AM  Advanced Heart Failure Team Pager [phone] (M-F; 7a - 4p)  Please contact [facility] for night-coverage after hours (4p -7a ) and weekends on [email or web address]  Patient seen and examined with Tonye Becket, NP. We discussed all aspects of the encounter. I agree with the assessment and plan as stated above.   He remains tenuous. Still markedly volume overloaded. Mild response to IV lasix. Will add metolazone. K low. So add spiro.   Echo reviewed personally with EF 30-35%. RV moderately HK.   Renal function improving with diuresis.   LE wounds wrapped. Erythema improving.   Will continue IV lasix. If no significant response can add milrinone.   Arvilla Meres, MD  4:01 PM

## 2017-01-08 NOTE — Progress Notes (Signed)
  Echocardiogram 2D Echocardiogram has been performed.  Donald Dunlap 01/08/2017, 11:40 AM

## 2017-01-09 DIAGNOSIS — N179 Acute kidney failure, unspecified: Secondary | ICD-10-CM

## 2017-01-09 DIAGNOSIS — I5023 Acute on chronic systolic (congestive) heart failure: Secondary | ICD-10-CM

## 2017-01-09 LAB — BASIC METABOLIC PANEL
Anion gap: 9 (ref 5–15)
BUN: 46 mg/dL — AB (ref 6–20)
CALCIUM: 9.4 mg/dL (ref 8.9–10.3)
CO2: 32 mmol/L (ref 22–32)
Chloride: 94 mmol/L — ABNORMAL LOW (ref 101–111)
Creatinine, Ser: 1.53 mg/dL — ABNORMAL HIGH (ref 0.61–1.24)
GFR calc Af Amer: 53 mL/min — ABNORMAL LOW (ref >60)
GFR, EST NON AFRICAN AMERICAN: 46 mL/min — AB (ref >60)
GLUCOSE: 89 mg/dL (ref 65–99)
Potassium: 4 mmol/L (ref 3.5–5.1)
Sodium: 135 mmol/L (ref 135–145)

## 2017-01-09 LAB — GLUCOSE, CAPILLARY
GLUCOSE-CAPILLARY: 214 mg/dL — AB (ref 65–99)
Glucose-Capillary: 107 mg/dL — ABNORMAL HIGH (ref 65–99)
Glucose-Capillary: 188 mg/dL — ABNORMAL HIGH (ref 65–99)
Glucose-Capillary: 92 mg/dL (ref 65–99)

## 2017-01-09 LAB — MAGNESIUM: Magnesium: 1.9 mg/dL (ref 1.7–2.4)

## 2017-01-09 LAB — HEMOGLOBIN A1C
Hgb A1c MFr Bld: 7.8 % — ABNORMAL HIGH (ref 4.8–5.6)
Mean Plasma Glucose: 177 mg/dL

## 2017-01-09 LAB — PROTIME-INR
INR: 3.17
Prothrombin Time: 33.2 seconds — ABNORMAL HIGH (ref 11.4–15.2)

## 2017-01-09 MED ORDER — MAGNESIUM SULFATE 2 GM/50ML IV SOLN
2.0000 g | Freq: Once | INTRAVENOUS | Status: AC
Start: 2017-01-09 — End: 2017-01-09
  Administered 2017-01-09: 2 g via INTRAVENOUS
  Filled 2017-01-09: qty 50

## 2017-01-09 MED ORDER — MAGNESIUM OXIDE 400 (241.3 MG) MG PO TABS: 400.0000 mg | ORAL_TABLET | Freq: Once | ORAL | Status: DC

## 2017-01-09 MED ORDER — MILRINONE LACTATE IN DEXTROSE 20-5 MG/100ML-% IV SOLN
0.2500 ug/kg/min | INTRAVENOUS | Status: DC
  Administered 2017-01-09 – 2017-01-14 (×12): 0.25 ug/kg/min via INTRAVENOUS
  Filled 2017-01-09 (×12): qty 100

## 2017-01-09 MED ORDER — WARFARIN SODIUM 1 MG PO TABS
0.5000 mg | ORAL_TABLET | Freq: Once | ORAL | Status: AC
  Administered 2017-01-09: 0.5 mg via ORAL
  Filled 2017-01-09: qty 1

## 2017-01-09 NOTE — Progress Notes (Signed)
Advanced Heart Failure Rounding Note   Subjective:    Remains on IV lasix and metolazone.  Had 2.3L of urine out but weight unchanged and creatinine rising. Feels a bit better but still weak and SOB.    Objective:   Weight Range:  Vital Signs:   Temp:  [97.4 F (36.3 C)-97.6 F (36.4 C)] 97.4 F (36.3 C) (04/21 0532) Pulse Rate:  [75-107] 75 (04/21 0532) Resp:  [17-18] 18 (04/21 0532) BP: (100-116)/(66-71) 110/71 (04/21 0532) SpO2:  [95 %-100 %] 95 % (04/21 0532) Weight:  [120.3 kg (265 lb 4.8 oz)] 120.3 kg (265 lb 4.8 oz) (04/21 0532) Last BM Date: 01/08/17  Weight change: Filed Weights   01/08/17 0545 01/08/17 0801 01/09/17 0532  Weight: 120.3 kg (265 lb 4.8 oz) 120.3 kg (265 lb 3.2 oz) 120.3 kg (265 lb 4.8 oz)    Intake/Output:   Intake/Output Summary (Last 24 hours) at 01/09/17 0803 Last data filed at 01/09/17 0533  Gross per 24 hour  Intake              360 ml  Output             2325 ml  Net            -1965 ml     Physical Exam: General:  Sitting in chair. Comfortable  HEENT: normal poor dentition Neck: supple. JVP 10  Carotids 2+ bilat; no bruits. No lymphadenopathy or thryomegaly appreciated. Cor: PMI nondisplaced. Irregular rate & rhythm. No rubs, gallops or murmurs. Lungs: Clear  Abdomen: obese, soft, nontender, + mildly distended. No hepatosplenomegaly. No bruits or masses. Good BS Extremities: no cyanosis, clubbing, rash, Lower extremity blisters with ACE wraps  3+ edeme+ edema Neuro:A&Ox3 cranial nerves grossly intact. moves all 4 extremities w/o difficulty. Affect pleasant   Telemetry: A fib 70s with occasional PVCs  Personally reviewed    Labs: Basic Metabolic Panel:  Recent Labs Lab 01/07/17 1421 01/08/17 0126 01/09/17 0421  NA 139 139 135  K 3.0* 3.3* 4.0  CL 99* 97* 94*  CO2 29 32 32  GLUCOSE 91 121* 89  BUN 45* 45* 46*  CREATININE 1.29* 1.44* 1.53*  CALCIUM 9.5 9.8 9.4  MG 1.7 2.1 1.9    Liver Function Tests:  Recent  Labs Lab 01/07/17 1421  AST 26  ALT 15*  ALKPHOS 74  BILITOT 1.5*  PROT 7.5  ALBUMIN 3.2*   No results for input(s): LIPASE, AMYLASE in the last 168 hours. No results for input(s): AMMONIA in the last 168 hours.  CBC:  Recent Labs Lab 01/07/17 1421  WBC 9.1  NEUTROABS 6.2  HGB 12.9*  HCT 42.0  MCV 81.7  PLT 154    Cardiac Enzymes:  Recent Labs Lab 01/07/17 1421 01/07/17 1912 01/08/17 0126  TROPONINI 0.09* 0.06* 0.08*    BNP: BNP (last 3 results)  Recent Labs  01/07/17 1421  BNP 695.6*    ProBNP (last 3 results) No results for input(s): PROBNP in the last 8760 hours.    Other results:  Imaging: Dg Chest Port 1 View  Result Date: 01/07/2017 CLINICAL DATA:  Dyspnea. EXAM: PORTABLE CHEST 1 VIEW COMPARISON:  None. FINDINGS: Single lead left-sided pacemaker with tip overlying the right ventricle. Marked cardiomegaly. Moderate pulmonary edema. No large pleural effusion seen on single AP view. No evidence confluent airspace disease, soft tissue attenuation partially obscures evaluation of the left lung base. No pneumothorax. IMPRESSION: Findings consistent with CHF, cardiomegaly and pulmonary edema. Electronically  Signed   By: Rubye Oaks M.D.   On: 01/07/2017 20:35     Medications:     Scheduled Medications: . allopurinol  100 mg Oral Daily  . aspirin EC  81 mg Oral Daily  . atorvastatin  20 mg Oral q1800  . calcitRIOL  0.25 mcg Oral Daily  . carvedilol  3.125 mg Oral BID WC  . furosemide  80 mg Intravenous BID  . glipiZIDE  5 mg Oral BID AC  . insulin aspart  0-20 Units Subcutaneous TID WC  . insulin aspart  15 Units Subcutaneous TID WC  . insulin glargine  25 Units Subcutaneous Daily  . pantoprazole  40 mg Oral BID WC  . potassium chloride  40 mEq Oral TID  . sodium chloride flush  3 mL Intravenous Q12H  . spironolactone  12.5 mg Oral Daily  . Warfarin - Pharmacist Dosing Inpatient   Does not apply q1800    Infusions: . sodium chloride       PRN Medications: sodium chloride, acetaminophen, ondansetron (ZOFRAN) IV, sodium chloride flush   Assessment/Plan/Discussion   1. 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  - Echo with EF 25-30%. Moderate RV dysfunction  - Still decompensated. Diuresing modestly with IV lasix and metolazone but still markedly fluid overloaded with R >>L HF - Renal function worse - Will add milrinone 0.25 to facilitate diuresis. - Continue 80 mg IV lasix twice a day + 2.5 mg metolazone.   - Continue lower dose of carvedilol. - Continue 12.5 mg spironolactone.   - Consider Entresto on d/c 2. CAD  - No ischemic symptoms  Recent myoview 2017  ok. Continue asa to statin 3. Chronic A fib - Rate controlled. INR 3.17 Discussed with HF pharmacist  4. Acute on CKD Stage III - Creatinine baseline 1.6-1.8. Was 1.29 on 4/19 - Today creatinine 1.53 5. OSA- intolerant CPAP 6. Chronic Respiratory Failure  - on  Home oxygen. Sats ok  7. R and LLE partial thickness wounds - WOC has seen. Using ACE wraps. Switch to [facility] boots as tolerated  8. Hypokalemia:  - Supped K 4.0 today   Length of Stay: 2 Bensimhon, Daniel MD  01/09/2017, 8:03 AM  Advanced Heart Failure Team Pager [phone] (M-F; 7a - 4p)  Please contact [facility] for night-coverage after hours (4p -7a ) and weekends on [email or web address]

## 2017-01-09 NOTE — Progress Notes (Signed)
ANTICOAGULATION CONSULT NOTE - Follow Up Consult  Pharmacy Consult for Coumadin Indication: atrial fibrillation  Allergies  Allergen Reactions  . Latex     Rash   . Oxycodone     hallucinations    Patient Measurements: Height:  (175.3 cm) Weight: 265 lb 4.8 oz (120.3 kg) IBW/kg (Calculated) : 70.7  Vital Signs: Temp: 97.4 F (36.3 C) (04/21 0532) Temp Source: Oral (04/21 0532) BP: 110/71 (04/21 0532) Pulse Rate: 75 (04/21 0532)  Labs:  Recent Labs  01/07/17 1421 01/07/17 1912 01/08/17 0126 01/09/17 0421  HGB 12.9*  --   --   --   HCT 42.0  --   --   --   PLT 154  --   --   --   LABPROT 32.3*  --  33.3* 33.2*  INR 3.06  --  3.18 3.17  CREATININE 1.29*  --  1.44* 1.53*  TROPONINI 0.09* 0.06* 0.08*  --     Estimated Creatinine Clearance: 61.6 mL/min (A) (by C-G formula based on SCr of 1.53 mg/dL (H)).  Assessment: 65yom continues on coumadin for afib. INR slightly above goal at 3.17, stable. Hg 12.9, plt wnl. No bleeding documented.  PTA dose:  daily except  Mon/Fri  Goal of Therapy:  INR 2-3  Monitor platelets by anticoagulation protocol: Yes   Plan:  1) Coumadin 0.5mg  PO x 1 tonight 2) Daily INR 3) Monitor CBC, s/sx bleeding   Babs Bertin, PharmD, BCPS Clinical Pharmacist 01/09/2017 11:41 AM

## 2017-01-10 DIAGNOSIS — I472 Ventricular tachycardia: Secondary | ICD-10-CM

## 2017-01-10 LAB — BASIC METABOLIC PANEL
Anion gap: 10 (ref 5–15)
BUN: 46 mg/dL — ABNORMAL HIGH (ref 6–20)
CO2: 29 mmol/L (ref 22–32)
Calcium: 9.7 mg/dL (ref 8.9–10.3)
Chloride: 94 mmol/L — ABNORMAL LOW (ref 101–111)
Creatinine, Ser: 1.58 mg/dL — ABNORMAL HIGH (ref 0.61–1.24)
GFR calc Af Amer: 51 mL/min — ABNORMAL LOW (ref >60)
GFR calc non Af Amer: 44 mL/min — ABNORMAL LOW (ref >60)
Glucose, Bld: 176 mg/dL — ABNORMAL HIGH (ref 65–99)
Potassium: 4.1 mmol/L (ref 3.5–5.1)
Sodium: 133 mmol/L — ABNORMAL LOW (ref 135–145)

## 2017-01-10 LAB — PROTIME-INR
INR: 2.84
PROTHROMBIN TIME: 30.4 s — AB (ref 11.4–15.2)

## 2017-01-10 LAB — GLUCOSE, CAPILLARY
GLUCOSE-CAPILLARY: 146 mg/dL — AB (ref 65–99)
Glucose-Capillary: 163 mg/dL — ABNORMAL HIGH (ref 65–99)
Glucose-Capillary: 202 mg/dL — ABNORMAL HIGH (ref 65–99)
Glucose-Capillary: 201 mg/dL — ABNORMAL HIGH (ref 65–99)

## 2017-01-10 MED ORDER — WARFARIN SODIUM 2 MG PO TABS
2.0000 mg | ORAL_TABLET | Freq: Once | ORAL | Status: AC
  Administered 2017-01-10: 2 mg via ORAL
  Filled 2017-01-10: qty 1

## 2017-01-10 MED ORDER — ACETAZOLAMIDE 250 MG PO TABS
250.0000 mg | ORAL_TABLET | Freq: Two times a day (BID) | ORAL | Status: AC
  Administered 2017-01-10 – 2017-01-12 (×6): 250 mg via ORAL
  Filled 2017-01-10 (×6): qty 1

## 2017-01-10 NOTE — Progress Notes (Signed)
ANTICOAGULATION CONSULT NOTE - Follow Up Consult  Pharmacy Consult for Coumadin Indication: atrial fibrillation  Allergies  Allergen Reactions  . Latex     Rash   . Oxycodone     hallucinations    Patient Measurements: Height:  (175.3 cm) Weight: 266 lb 11.2 oz (121 kg) IBW/kg (Calculated) : 70.7  Vital Signs: Temp: 97.5 F (36.4 C) (04/22 0830) Temp Source: Axillary (04/22 0830) BP: 110/54 (04/22 0830) Pulse Rate: 82 (04/22 0830)  Labs:  Recent Labs  01/07/17 1421 01/07/17 1912 01/08/17 0126 01/09/17 0421 01/10/17 0444  HGB 12.9*  --   --   --   --   HCT 42.0  --   --   --   --   PLT 154  --   --   --   --   LABPROT 32.3*  --  33.3* 33.2* 30.4*  INR 3.06  --  3.18 3.17 2.84  CREATININE 1.29*  --  1.44* 1.53* 1.58*  TROPONINI 0.09* 0.06* 0.08*  --   --     Estimated Creatinine Clearance: 59.9 mL/min (A) (by C-G formula based on SCr of 1.58 mg/dL (H)).  Assessment: 65yom continues on coumadin for afib. INR previously slightly above goal, now down to 2.84 after 3 days of reduced doses. Hg 12.9, plt wnl. No bleeding documented.  PTA dose:  daily except  Mon/Fri  Goal of Therapy:  INR 2-3  Monitor platelets by anticoagulation protocol: Yes   Plan:  1) Coumadin  PO x 1 tonight 2) Daily INR 3) Monitor CBC, s/sx bleeding   Babs Bertin, PharmD, BCPS Clinical Pharmacist 01/10/2017 11:26 AM

## 2017-01-10 NOTE — Progress Notes (Signed)
This RN is unable to collect the Co-oxemetry ordered, PT does not have a central line.

## 2017-01-10 NOTE — Progress Notes (Signed)
Pt's BLE dsg changes completed. Dionne Bucy RN

## 2017-01-10 NOTE — Progress Notes (Signed)
Advanced Heart Failure Rounding Note   Subjective:    Milrinone 0.25 added 4/21 for sluggish diuresis. Has 18-beat NSVT yesterday.Mag 1.9 was supped.   Remains on IV lasix and metolazone.  Had 2.8L of urine out but weight up a pound.  Creatinine continues to drift up a bit. Continue to have cough. Otherwise feels ok.    Objective:   Weight Range:  Vital Signs:   Temp:  [95.1 F (35.1 C)-97.7 F (36.5 C)] 97.1 F (36.2 C) (04/22 0127) Pulse Rate:  [68-99] 84 (04/22 0127) Resp:  [18] 18 (04/22 0127) BP: (96-125)/(64-75) 96/64 (04/22 0127) SpO2:  [90 %-99 %] 90 % (04/22 0127) Weight:  [121 kg (266 lb 11.2 oz)] 121 kg (266 lb 11.2 oz) (04/22 0652) Last BM Date: 01/08/17  Weight change: Filed Weights   01/08/17 0801 01/09/17 0532 01/10/17 0652  Weight: 120.3 kg (265 lb 3.2 oz) 120.3 kg (265 lb 4.8 oz) 121 kg (266 lb 11.2 oz)    Intake/Output:   Intake/Output Summary (Last 24 hours) at 01/10/17 0817 Last data filed at 01/10/17 0500  Gross per 24 hour  Intake          1326.35 ml  Output             2750 ml  Net         -1423.65 ml     Physical Exam: General:  Sitting in chair. Hacking cough HEENT: normal poor dentition  Neck: supple. JVP 10  Carotids 2+ bilat; no bruits. No lymphadenopathy or thryomegaly appreciated. Cor: PMI nondisplaced. Irregular rate & rhythm. No rubs, gallops or murmurs. Lungs: Decreased BS throughout  Abdomen: obese, soft, nontender, + distended. No hepatosplenomegaly. No bruits or masses. Good BS Extremities: no cyanosis, clubbing, rash, Lower extremity blisters with ACE wraps 2-3+ edema.  Neuro:A&Ox3 cranial nerves grossly intact. moves all 4 extremities w/o difficulty. Affect pleasant   Telemetry: A fib 80s with PVCs and 18-beat run NSVT Personally reviewed     Labs: Basic Metabolic Panel:  Recent Labs Lab 01/07/17 1421 01/08/17 0126 01/09/17 0421 01/10/17 0444  NA 139 139 135 133*  K 3.0* 3.3* 4.0 4.1  CL 99* 97* 94* 94*    CO2 29 32 32 29  GLUCOSE 91 121* 89 176*  BUN 45* 45* 46* 46*  CREATININE 1.29* 1.44* 1.53* 1.58*  CALCIUM 9.5 9.8 9.4 9.7  MG 1.7 2.1 1.9  --     Liver Function Tests:  Recent Labs Lab 01/07/17 1421  AST 26  ALT 15*  ALKPHOS 74  BILITOT 1.5*  PROT 7.5  ALBUMIN 3.2*   No results for input(s): LIPASE, AMYLASE in the last 168 hours. No results for input(s): AMMONIA in the last 168 hours.  CBC:  Recent Labs Lab 01/07/17 1421  WBC 9.1  NEUTROABS 6.2  HGB 12.9*  HCT 42.0  MCV 81.7  PLT 154    Cardiac Enzymes:  Recent Labs Lab 01/07/17 1421 01/07/17 1912 01/08/17 0126  TROPONINI 0.09* 0.06* 0.08*    BNP: BNP (last 3 results)  Recent Labs  01/07/17 1421  BNP 695.6*    ProBNP (last 3 results) No results for input(s): PROBNP in the last 8760 hours.    Other results:  Imaging: No results found.   Medications:     Scheduled Medications: . allopurinol  100 mg Oral Daily  . aspirin EC  81 mg Oral Daily  . atorvastatin  20 mg Oral q1800  . calcitRIOL  0.25 mcg  Oral Daily  . carvedilol  3.125 mg Oral BID WC  . furosemide  80 mg Intravenous BID  . glipiZIDE  5 mg Oral BID AC  . insulin aspart  0-20 Units Subcutaneous TID WC  . insulin aspart  15 Units Subcutaneous TID WC  . insulin glargine  25 Units Subcutaneous Daily  . pantoprazole  40 mg Oral BID WC  . potassium chloride  40 mEq Oral TID  . sodium chloride flush  3 mL Intravenous Q12H  . spironolactone  12.5 mg Oral Daily  . Warfarin - Pharmacist Dosing Inpatient   Does not apply q1800    Infusions: . sodium chloride    . milrinone 0.25 mcg/kg/min (01/10/17 0624)    PRN Medications: sodium chloride, acetaminophen, ondansetron (ZOFRAN) IV, sodium chloride flush   Assessment/Plan/Discussion   1. 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  - Echo with EF 25-30%. Moderate RV dysfunction  - Remains volume overloaded. Milrinone added to facilitate diuresis. Reasonable diuresis but  weight still climbing - Will add acetazolamide  - Continue milrinone 0.25 to facilitate diuresis. - Continue 80 mg IV lasix twice a day + 2.5 mg metolazone.   - Continue lower dose of carvedilol. - Continue 12.5 mg spironolactone.   - Consider Entresto on d/c - Discussed need for fluid restriction with him and nurse - If no improvement will plan RHC tomorrow  2. CAD  - No ischemic symptoms  Recent myoview 2017  ok. Continue asa to statin 3. Chronic A fib - Rate controlled. INR 2.84  Discussed with HF pharmacist  4. Acute on CKD Stage III - Creatinine baseline 1.6-1.8. Was 1.29 on 4/19 - Today creatinine 1.58 5. OSA- intolerant CPAP 6. Chronic Respiratory Failure  - on  Home oxygen. Sats ok  7. R and LLE partial thickness wounds - WOC has seen. Using ACE wraps. RN to change dressings today 8. Hypokalemia:  - Supped K 4.1 today 9. NSVT - Asymptomatic. K and mag supped.    Length of Stay: 3 Bensimhon, Daniel MD  01/10/2017, 8:17 AM  Advanced Heart Failure Team Pager [phone] (M-F; 7a - 4p)  Please contact [facility] for night-coverage after hours (4p -7a ) and weekends on [email or web address]

## 2017-01-11 ENCOUNTER — Encounter (HOSPITAL_COMMUNITY)
Admission: RE | Admit: 2017-01-11 | Discharge: 2017-01-11 | Disposition: A | Discharge status: Home / Self Care | Payer: Medicare Other | Source: Ambulatory Visit | Attending: Nephrology | Admitting: Nephrology

## 2017-01-11 DIAGNOSIS — S81801A Unspecified open wound, right lower leg, initial encounter: Secondary | ICD-10-CM

## 2017-01-11 LAB — BASIC METABOLIC PANEL
Anion gap: 11 (ref 5–15)
BUN: 44 mg/dL — AB (ref 6–20)
CO2: 30 mmol/L (ref 22–32)
Calcium: 9.9 mg/dL (ref 8.9–10.3)
Chloride: 95 mmol/L — ABNORMAL LOW (ref 101–111)
Creatinine, Ser: 1.64 mg/dL — ABNORMAL HIGH (ref 0.61–1.24)
GFR calc Af Amer: 49 mL/min — ABNORMAL LOW (ref >60)
GFR, EST NON AFRICAN AMERICAN: 42 mL/min — AB (ref >60)
GLUCOSE: 140 mg/dL — AB (ref 65–99)
POTASSIUM: 4.2 mmol/L (ref 3.5–5.1)
Sodium: 136 mmol/L (ref 135–145)

## 2017-01-11 LAB — GLUCOSE, CAPILLARY
GLUCOSE-CAPILLARY: 173 mg/dL — AB (ref 65–99)
Glucose-Capillary: 153 mg/dL — ABNORMAL HIGH (ref 65–99)
Glucose-Capillary: 134 mg/dL — ABNORMAL HIGH (ref 65–99)
Glucose-Capillary: 221 mg/dL — ABNORMAL HIGH (ref 65–99)

## 2017-01-11 LAB — PROTIME-INR
INR: 2.16
Prothrombin Time: 24.4 seconds — ABNORMAL HIGH (ref 11.4–15.2)

## 2017-01-11 MED ORDER — SPIRONOLACTONE 25 MG PO TABS
25.0000 mg | ORAL_TABLET | Freq: Every day | ORAL | Status: DC
  Administered 2017-01-11 – 2017-01-21 (×10): 25 mg via ORAL
  Filled 2017-01-11 (×10): qty 1

## 2017-01-11 MED ORDER — WARFARIN SODIUM 4 MG PO TABS
4.0000 mg | ORAL_TABLET | Freq: Once | ORAL | Status: AC
  Administered 2017-01-11: 4 mg via ORAL
  Filled 2017-01-11: qty 1

## 2017-01-11 NOTE — Progress Notes (Signed)
Pt has 4 beats of Vtach - non Sustained. Pt alert, oriented and asymptomatic  in no apparent distress. Will continue to monitor.

## 2017-01-11 NOTE — Consult Note (Addendum)
WOC Nurse wound follow up Wound type: WOC consult was previously performed on 4/19 for cellulitis and blisters.  Previous blisters have evolved into several full thickness wounds to BLE.  Leg appearance has improved since previous assessment; there is significantly less erythremia, edema, and no further significant amt of yellow drainage. Measurement: Left leg with full thickness stasis ulcers; 5X6X.1cm and 5X2X.1cm to anterior leg, 6X3X.1cm to posterior leg.  All wounds are 100% beefy red with minimal amt yellow drainage, no odor, painful to touch. Right leg with full thickness stasis ulcers; .8X.8X.1cm and 5X6X.1cm to anterior leg, .8X.3X.1cm to posterior leg. All wounds are 100% beefy red with minimal amt yellow drainage, no odor, painful to touch. Dressing procedure/placement/frequency: Foam dressings to protect wounds and decrease adherence over the wound and reduce discomfort with dressing changes; continue kerlex and ace wraps for light compression.  Pt states he had Una boots in the past and is unable to tolerate them "because they are too tight and painful."  Now that wounds are not leaking a significant amt of fluid, bedside nurses should change the dressings 3 times a week. Discussed plan of care with patient and he verbalized understanding. Pt could benefit from home health assistance after discharge for dressing changes; please order if desired. Please re-consult if further assistance is needed.  Thank-you,  Cammie Mcgee MSN, RN, CWOCN, [place], CNS [phone]

## 2017-01-11 NOTE — Progress Notes (Signed)
Notified from CCMD that patient has 11 beats of wide QRS.will continue to monitor

## 2017-01-11 NOTE — Progress Notes (Signed)
Advanced Heart Failure Rounding Note   Subjective:    Milrinone 0.25 added 4/21 for sluggish diuresis. Continues to diurese with IV lasix + metolazone. Diamox added yesterday. Brisk diuresis noted. Weight down 2 pounds. Breathing better.  Seen by WOC and wounds noted to be healing. Erythema decreased. Refused UNNA boots   Ongoing dyspnea with exertion. Complaining of leg pain.   Objective:   Weight Range:  Vital Signs:   Temp:  [97.5 F (36.4 C)-97.9 F (36.6 C)] 97.7 F (36.5 C) (04/23 0452) Pulse Rate:  [75-90] 90 (04/23 0452) Resp:  [18-20] 18 (04/23 0452) BP: (110-112)/(54-67) 112/61 (04/23 0452) SpO2:  [90 %-100 %] 90 % (04/23 0452) Weight:  [264 lb 11.2 oz (120.1 kg)] 264 lb 11.2 oz (120.1 kg) (04/23 0452) Last BM Date: 01/08/17  Weight change: Filed Weights   01/09/17 0532 01/10/17 0652 01/11/17 0452  Weight: 265 lb 4.8 oz (120.3 kg) 266 lb 11.2 oz (121 kg) 264 lb 11.2 oz (120.1 kg)    Intake/Output:   Intake/Output Summary (Last 24 hours) at 01/11/17 0718 Last data filed at 01/10/17 2329  Gross per 24 hour  Intake              269 ml  Output             2900 ml  Net            -2631 ml     Physical Exam: General:  Chronically ill appearing. No resp difficulty HEENT: normal Neck: supple. JVP 10-11. Carotids 2+ bilat; no bruits. No lymphadenopathy or thryomegaly appreciated. Cor: PMI nondisplaced. Irregular rate & rhythm. No rubs, gallops or murmurs. Lungs: clear Abdomen: soft, nontender, nondistended. No hepatosplenomegaly. No bruits or masses. Good bowel sounds. Extremities: no cyanosis, clubbing, rash, R and LLE ace wraps.  Neuro: alert & orientedx3, cranial nerves grossly intact. moves all 4 extremities w/o difficulty. Affect pleasant   Telemetry: A fib 80-90s. NSVT Personally reviewed.    Labs: Basic Metabolic Panel:  Recent Labs Lab 01/07/17 1421 01/08/17 0126 01/09/17 0421 01/10/17 0444 01/11/17 0418  NA 139 139 135 133* 136  K  3.0* 3.3* 4.0 4.1 4.2  CL 99* 97* 94* 94* 95*  CO2 29 32 32 29 30  GLUCOSE 91 121* 89 176* 140*  BUN 45* 45* 46* 46* 44*  CREATININE 1.29* 1.44* 1.53* 1.58* 1.64*  CALCIUM 9.5 9.8 9.4 9.7 9.9  MG 1.7 2.1 1.9  --   --     Liver Function Tests:  Recent Labs Lab 01/07/17 1421  AST 26  ALT 15*  ALKPHOS 74  BILITOT 1.5*  PROT 7.5  ALBUMIN 3.2*   No results for input(s): LIPASE, AMYLASE in the last 168 hours. No results for input(s): AMMONIA in the last 168 hours.  CBC:  Recent Labs Lab 01/07/17 1421  WBC 9.1  NEUTROABS 6.2  HGB 12.9*  HCT 42.0  MCV 81.7  PLT 154    Cardiac Enzymes:  Recent Labs Lab 01/07/17 1421 01/07/17 1912 01/08/17 0126  TROPONINI 0.09* 0.06* 0.08*    BNP: BNP (last 3 results)  Recent Labs  01/07/17 1421  BNP 695.6*    ProBNP (last 3 results) No results for input(s): PROBNP in the last 8760 hours.    Other results:  Imaging: No results found.   Medications:     Scheduled Medications: . acetaZOLAMIDE  250 mg Oral BID  . allopurinol  100 mg Oral Daily  . aspirin EC  81  mg Oral Daily  . atorvastatin  20 mg Oral q1800  . calcitRIOL  0.25 mcg Oral Daily  . carvedilol  3.125 mg Oral BID WC  . furosemide  80 mg Intravenous BID  . glipiZIDE  5 mg Oral BID AC  . insulin aspart  0-20 Units Subcutaneous TID WC  . insulin aspart  15 Units Subcutaneous TID WC  . insulin glargine  25 Units Subcutaneous Daily  . pantoprazole  40 mg Oral BID WC  . potassium chloride  40 mEq Oral TID  . sodium chloride flush  3 mL Intravenous Q12H  . spironolactone  12.5 mg Oral Daily  . Warfarin - Pharmacist Dosing Inpatient   Does not apply q1800    Infusions: . sodium chloride    . milrinone 0.25 mcg/kg/min (01/11/17 0451)    PRN Medications: sodium chloride, acetaminophen, ondansetron (ZOFRAN) IV, sodium chloride flush   Assessment/Plan/Discussion   1. 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  - Echo with EF 25-30%. Moderate RV  dysfunction  - Remains volume overloaded but had increased urine output.  Continue milrinone 0.25 mcg.  Continue lasix 80 mg twice a day + metolazone 2.5 mg daily + diamox 250 mg twice a day (Day 1/3) - Continue lower dose of carvedilol. - Increase spiro 25 mg daily.    - Consider Entresto on d/c - Discussed need for fluid restriction with him and nurse Possible RHC later today.  2. CAD  - No CP. No ischemic symptoms  Recent myoview 2017  ok. Continue asa to statin 3. Chronic A fib - Rate controlled. INR 2.16. Pharmacy to dose coumadin.   4. Acute on CKD Stage III - Creatinine baseline 1.6-1.8. Was 1.29 on 4/19 Todays creatinine 1.6. Stable.  5. . Chronic Respiratory Failure  - on  Home oxygen. O2 sats >90% 6. R and LLE partial thickness wounds - WOC consulted with recommendations. Continue conservative wound care + mild compression.  7. Hypokalemia:  K 4.2  8. NSVT - Asymptomatic. K and mag supped.    Length of Stay: 4 Amy Clegg NP-C  01/11/2017, 7:18 AM  Advanced Heart Failure Team Pager [phone] (M-F; 7a - 4p)  Please contact [facility] for night-coverage after hours (4p -7a ) and weekends on [email or web address]  Patient seen and examined with Tonye Becket, NP. We discussed all aspects of the encounter. I agree with the assessment and plan as stated above.   Volume status remains elevated but finally seems to be improving with milrinone support and IV diuresis + diamox. Will continue current regimen. Will need RHC later in the week after full diuresis and off milrinone.  Renal function up slightly. Will continue to follow closely with IV diuresis. K ok.   AF is chronic [place] on coumadin. Dosing d/w PharmD.   WOC has seen again today (thanks). Wounds healing. Continue wraps.   Arvilla Meres, MD  10:52 AM

## 2017-01-11 NOTE — Progress Notes (Signed)
ANTICOAGULATION CONSULT NOTE - Follow Up Consult  Pharmacy Consult for Coumadin Indication: atrial fibrillation  Allergies  Allergen Reactions  . Latex     Rash   . Oxycodone     hallucinations    Patient Measurements: Height:  (175.3 cm) Weight: 264 lb 11.2 oz (120.1 kg) IBW/kg (Calculated) : 70.7  Vital Signs: Temp: 97.7 F (36.5 C) (04/23 0452) Temp Source: Oral (04/23 0452) BP: 112/61 (04/23 0452) Pulse Rate: 90 (04/23 0452)  Labs:  Recent Labs  01/09/17 0421 01/10/17 0444 01/11/17 0418  LABPROT 33.2* 30.4* 24.4*  INR 3.17 2.84 2.16  CREATININE 1.53* 1.58* 1.64*    Estimated Creatinine Clearance: 57.5 mL/min (A) (by C-G formula based on SCr of 1.64 mg/dL (H)).  Assessment: 65yom continues on coumadin for afib. INR previously slightly above goal peak at 3.18, now down to 2.16 after 4 days of reduced doses - expect to see INR continue to fall will resume home dose and monitor. Hg 12.9, plt wnl. No bleeding documented.  PTA dose:  daily except  Mon/Fri  Goal of Therapy:  INR 2-3  Monitor platelets by anticoagulation protocol: Yes   Plan:  1) Coumadin  PO x 1 tonight 2) Daily INR 3) Monitor CBC, s/sx bleeding   Leota Sauers Pharm.D. CPP, BCPS Clinical Pharmacist [phone] 01/11/2017 12:57 PM

## 2017-01-11 NOTE — Care Management Important Message (Signed)
Important Message  Patient Details  Name: Donald Dunlap MRN: [id number] Date of Birth: 10-Sep-1951   Medicare Important Message Given:  Yes    Merlin Golden Stefan Church 01/11/2017, 3:29 PM

## 2017-01-12 DIAGNOSIS — I2510 Atherosclerotic heart disease of native coronary artery without angina pectoris: Secondary | ICD-10-CM

## 2017-01-12 LAB — BASIC METABOLIC PANEL
Anion gap: 9 (ref 5–15)
BUN: 41 mg/dL — ABNORMAL HIGH (ref 6–20)
CALCIUM: 10 mg/dL (ref 8.9–10.3)
CHLORIDE: 96 mmol/L — AB (ref 101–111)
CO2: 31 mmol/L (ref 22–32)
CREATININE: 1.51 mg/dL — AB (ref 0.61–1.24)
GFR calc Af Amer: 54 mL/min — ABNORMAL LOW (ref >60)
GFR, EST NON AFRICAN AMERICAN: 47 mL/min — AB (ref >60)
GLUCOSE: 151 mg/dL — AB (ref 65–99)
POTASSIUM: 4.5 mmol/L (ref 3.5–5.1)
Sodium: 136 mmol/L (ref 135–145)

## 2017-01-12 LAB — MAGNESIUM: Magnesium: 1.9 mg/dL (ref 1.7–2.4)

## 2017-01-12 LAB — GLUCOSE, CAPILLARY
GLUCOSE-CAPILLARY: 136 mg/dL — AB (ref 65–99)
GLUCOSE-CAPILLARY: 81 mg/dL (ref 65–99)

## 2017-01-12 LAB — PROTIME-INR
INR: 2.32
Prothrombin Time: 25.8 seconds — ABNORMAL HIGH (ref 11.4–15.2)

## 2017-01-12 MED ORDER — TRAMADOL HCL 50 MG PO TABS
50.0000 mg | ORAL_TABLET | Freq: Three times a day (TID) | ORAL | Status: DC | PRN
  Administered 2017-01-12 – 2017-01-16 (×6): 50 mg via ORAL
  Filled 2017-01-12 (×6): qty 1

## 2017-01-12 MED ORDER — MAGNESIUM SULFATE 2 GM/50ML IV SOLN
2.0000 g | Freq: Once | INTRAVENOUS | Status: AC
  Administered 2017-01-12: 2 g via INTRAVENOUS
  Filled 2017-01-12: qty 50

## 2017-01-12 MED ORDER — WARFARIN SODIUM 2 MG PO TABS
2.0000 mg | ORAL_TABLET | Freq: Once | ORAL | Status: AC
  Administered 2017-01-12: 2 mg via ORAL
  Filled 2017-01-12: qty 1

## 2017-01-12 NOTE — Progress Notes (Signed)
Advanced Heart Failure Rounding Note   Subjective:    Milrinone 0.25 added 4/21 for sluggish diuresis. Continues to diurese with IV lasix + metolazone. Diamox added 4/22 with good results.  Down 4 pounds overnight. Breathing better. Creatinine improving.  Seen by WOC and wounds noted to be healing. Erythema decreased. Refused UNNA boots    Objective:   Weight Range:  Vital Signs:   Temp:  [97.3 F (36.3 C)-97.8 F (36.6 C)] 97.8 F (36.6 C) (04/24 0501) Pulse Rate:  [68-83] 68 (04/24 0501) Resp:  [18] 18 (04/24 0501) BP: (102-105)/(60-66) 105/60 (04/24 0501) SpO2:  [97 %-98 %] 97 % (04/24 0501) Weight:  [118.3 kg (260 lb 12.8 oz)] 118.3 kg (260 lb 12.8 oz) (04/24 0501) Last BM Date: 01/10/17  Weight change: Filed Weights   01/10/17 0652 01/11/17 0452 01/12/17 0501  Weight: 121 kg (266 lb 11.2 oz) 120.1 kg (264 lb 11.2 oz) 118.3 kg (260 lb 12.8 oz)    Intake/Output:   Intake/Output Summary (Last 24 hours) at 01/12/17 0507 Last data filed at 01/12/17 0502  Gross per 24 hour  Intake                0 ml  Output             2150 ml  Net            -2150 ml     Physical Exam: General:  Sitting in chair No resp difficulty HEENT: normal Neck: supple. JVP 8-9. Carotids 2+ bilat; no bruits. No lymphadenopathy or thryomegaly appreciated. Cor: PMI nondisplaced. Irregular rate & rhythm. No murmur Lungs: clear no wheeze Abdomen: soft, nontender, nondistended. No hepatosplenomegaly. No bruits or masses. Good BS Extremities: no cyanosis, clubbing, rash, R and LLE ace wraps.  Thigh edema resolved. Still with 1-2+ LLE edema  Neuro: alert & orientedx3, cranial nerves grossly intact. moves all 4 extremities w/o difficulty. Affect pleasant   Telemetry: A fib 80s. 11-beat NSVT   Personally reviewed .    Labs: Basic Metabolic Panel:  Recent Labs Lab 01/07/17 1421 01/08/17 0126 01/09/17 0421 01/10/17 0444 01/11/17 0418 01/12/17 0149  NA 139 139 135 133* 136 136  K  3.0* 3.3* 4.0 4.1 4.2 4.5  CL 99* 97* 94* 94* 95* 96*  CO2 29 32 32 GLUCOSE 91 121* 89 176* 140* 151*  BUN 45* 45* 46* 46* 44* 41*  CREATININE 1.29* 1.44* 1.53* 1.58* 1.64* 1.51*  CALCIUM 9.5 9.8 9.4 9.7 9.9 10.0  MG 1.7 2.1 1.9  --   --  1.9    Liver Function Tests:  Recent Labs Lab 01/07/17 1421  AST 26  ALT 15*  ALKPHOS 74  BILITOT 1.5*  PROT 7.5  ALBUMIN 3.2*   No results for input(s): LIPASE, AMYLASE in the last 168 hours. No results for input(s): AMMONIA in the last 168 hours.  CBC:  Recent Labs Lab 01/07/17 1421  WBC 9.1  NEUTROABS 6.2  HGB 12.9*  HCT 42.0  MCV 81.7  PLT 154    Cardiac Enzymes:  Recent Labs Lab 01/07/17 1421 01/07/17 1912 01/08/17 0126  TROPONINI 0.09* 0.06* 0.08*    BNP: BNP (last 3 results)  Recent Labs  01/07/17 1421  BNP 695.6*    ProBNP (last 3 results) No results for input(s): PROBNP in the last 8760 hours.    Other results:  Imaging: No results found.   Medications:     Scheduled Medications: . acetaZOLAMIDE  250 mg Oral BID  . allopurinol  100 mg Oral Daily  . aspirin EC  81 mg Oral Daily  . atorvastatin  20 mg Oral q1800  . calcitRIOL  0.25 mcg Oral Daily  . carvedilol  3.125 mg Oral BID WC  . furosemide  80 mg Intravenous BID  . glipiZIDE  5 mg Oral BID AC  . insulin aspart  0-20 Units Subcutaneous TID WC  . insulin aspart  15 Units Subcutaneous TID WC  . insulin glargine  25 Units Subcutaneous Daily  . pantoprazole  40 mg Oral BID WC  . potassium chloride  40 mEq Oral TID  . sodium chloride flush  3 mL Intravenous Q12H  . spironolactone  25 mg Oral Daily  . Warfarin - Pharmacist Dosing Inpatient   Does not apply q1800    Infusions: . sodium chloride    . milrinone 0.25 mcg/kg/min (01/12/17 0030)    PRN Medications: sodium chloride, acetaminophen, ondansetron (ZOFRAN) IV, sodium chloride flush   Assessment/Plan/Discussion   1. 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  -  Echo with EF 25-30%. Moderate RV dysfunction  - Volume status improving on current regimen. Responding well to diamox  Continue milrinone 0.25 mcg.  Continue lasix 80 mg twice a day + metolazone 2.5 mg daily + diamox 250 mg twice a day (Day 2/3) - Continue lower dose of carvedilol. - Continue spiro 25 mg daily. Stop K with rising serum K   - Likely begin milrinone wean tomorrow with initiation of Entresto - Discussed need for fluid restriction with him and nurse - RHC later in week once off milrinone  2. CAD  - No CP. No ischemic sx Recent myoview 2017  ok. Continue asa to statin 3. Chronic A fib - Rate controlled. INR 2.3. Discussed with PharmD  4. Acute on CKD Stage III - Creatinine baseline 1.6-1.8. Was 1.29 on 4/19 Todays creatinine 1.5 is improved  5. . Chronic Respiratory Failure  - on  Home oxygen. O2 sats >90% 6. R and LLE partial thickness wounds - WOC consulted with recommendations. Continue conservative wound care + mild compression. Wounds are improving. No evidence of cellulitis  7. Hypokalemia:  -resolved  8. NSVT - Continues with runs of NSVT. Asymptomatic. K ok. Check mag in am - He has ICD.  Arvilla Meres, MD  5:12 AM     Length of Stay: 5  Advanced Heart Failure Team Pager [phone] (M-F; 7a - 4p)  Please contact [facility] for night-coverage after hours (4p -7a ) and weekends on [email or web address]

## 2017-01-12 NOTE — Progress Notes (Signed)
ANTICOAGULATION CONSULT NOTE - Follow Up Consult  Pharmacy Consult for Coumadin Indication: atrial fibrillation  Allergies  Allergen Reactions  . Latex     Rash   . Oxycodone     hallucinations    Patient Measurements: Height:  (175.3 cm) Weight: 260 lb 12.8 oz (118.3 kg) IBW/kg (Calculated) : 70.7  Vital Signs: Temp: 97.8 F (36.6 C) (04/24 0501) Temp Source: Oral (04/24 0501) BP: 105/60 (04/24 0501) Pulse Rate: 68 (04/24 0501)  Labs:  Recent Labs  01/10/17 0444 01/11/17 0418 01/12/17 0149  LABPROT 30.4* 24.4* 25.8*  INR 2.84 2.16 2.32  CREATININE 1.58* 1.64* 1.51*    Estimated Creatinine Clearance: 61.9 mL/min (A) (by C-G formula based on SCr of 1.51 mg/dL (H)).  Assessment: 65yom continues on coumadin for afib. INR previously slightly above goal peak at 3.18, then down to 2.16 after 4 days of reduced doses -then bump up 2.3 with home dose last pm - may need to reduce PTA dosing. Hg 12.9, plt wnl. No bleeding documented.  PTA dose:  daily except  Mon/Fri  Goal of Therapy:  INR 2-3  Monitor platelets by anticoagulation protocol: Yes   Plan:  1) Coumadin  PO x 1 tonight 2) Daily INR 3) Monitor CBC, s/sx bleeding   Leota Sauers Pharm.D. CPP, BCPS Clinical Pharmacist [phone] 01/12/2017 11:54 AM

## 2017-01-13 LAB — CBC WITH DIFFERENTIAL/PLATELET
BASOS ABS: 0 10*3/uL (ref 0.0–0.1)
Basophils Relative: 0 %
EOS ABS: 0.4 10*3/uL (ref 0.0–0.7)
EOS PCT: 6 %
HCT: 40.5 % (ref 39.0–52.0)
Hemoglobin: 12.1 g/dL — ABNORMAL LOW (ref 13.0–17.0)
Lymphocytes Relative: 22 %
Lymphs Abs: 1.4 10*3/uL (ref 0.7–4.0)
MCH: 25.4 pg — ABNORMAL LOW (ref 26.0–34.0)
MCHC: 29.9 g/dL — ABNORMAL LOW (ref 30.0–36.0)
MCV: 85.1 fL (ref 78.0–100.0)
MONOS PCT: 8 %
Monocytes Absolute: 0.5 10*3/uL (ref 0.1–1.0)
NEUTROS PCT: 64 %
Neutro Abs: 4.1 10*3/uL (ref 1.7–7.7)
PLATELETS: 156 10*3/uL (ref 150–400)
RBC: 4.76 MIL/uL (ref 4.22–5.81)
RDW: 21.9 % — AB (ref 11.5–15.5)
WBC: 6.4 10*3/uL (ref 4.0–10.5)

## 2017-01-13 LAB — GLUCOSE, CAPILLARY
GLUCOSE-CAPILLARY: 204 mg/dL — AB (ref 65–99)
GLUCOSE-CAPILLARY: 103 mg/dL — AB (ref 65–99)
GLUCOSE-CAPILLARY: 71 mg/dL (ref 65–99)
Glucose-Capillary: 167 mg/dL — ABNORMAL HIGH (ref 65–99)
Glucose-Capillary: 128 mg/dL — ABNORMAL HIGH (ref 65–99)
Glucose-Capillary: 162 mg/dL — ABNORMAL HIGH (ref 65–99)
Glucose-Capillary: 74 mg/dL (ref 65–99)

## 2017-01-13 LAB — BASIC METABOLIC PANEL
Anion gap: 12 (ref 5–15)
BUN: 39 mg/dL — AB (ref 6–20)
CO2: 26 mmol/L (ref 22–32)
CREATININE: 1.74 mg/dL — AB (ref 0.61–1.24)
Calcium: 9.9 mg/dL (ref 8.9–10.3)
Chloride: 95 mmol/L — ABNORMAL LOW (ref 101–111)
GFR calc Af Amer: 46 mL/min — ABNORMAL LOW (ref >60)
GFR, EST NON AFRICAN AMERICAN: 39 mL/min — AB (ref >60)
GLUCOSE: 136 mg/dL — AB (ref 65–99)
Potassium: 4 mmol/L (ref 3.5–5.1)
SODIUM: 133 mmol/L — AB (ref 135–145)

## 2017-01-13 LAB — PROTIME-INR
INR: 2.28
Prothrombin Time: 25.5 seconds — ABNORMAL HIGH (ref 11.4–15.2)

## 2017-01-13 MED ORDER — METOLAZONE 2.5 MG PO TABS: 2.5000 mg | ORAL_TABLET | Freq: Every day | ORAL | Status: DC

## 2017-01-13 MED ORDER — WARFARIN SODIUM 4 MG PO TABS
4.0000 mg | ORAL_TABLET | ORAL | Status: DC
  Administered 2017-01-13 – 2017-01-20 (×4): 4 mg via ORAL
  Filled 2017-01-13 (×4): qty 1

## 2017-01-13 MED ORDER — WARFARIN SODIUM 2 MG PO TABS
2.0000 mg | ORAL_TABLET | ORAL | Status: DC
  Administered 2017-01-14 – 2017-01-19 (×4): 2 mg via ORAL
  Filled 2017-01-13 (×4): qty 1

## 2017-01-13 MED ORDER — POTASSIUM CHLORIDE CRYS ER 20 MEQ PO TBCR
20.0000 meq | EXTENDED_RELEASE_TABLET | Freq: Once | ORAL | Status: AC
  Administered 2017-01-13: 20 meq via ORAL
  Filled 2017-01-13: qty 1

## 2017-01-13 NOTE — Progress Notes (Signed)
Advanced Heart Failure Rounding Note   Subjective:    Milrinone 0.25 added 4/21 for sluggish diuresis. Continues to diurese with IV lasix. Diamox added 4/22 with good results.  Weight down another 2 pounds. Brisk diuresis noted. Negative 3 liters.   Remains SOB with movement.  Unable to walk much due to leg pain. Sleeps in chair.   Objective:   Weight Range:  Vital Signs:   Temp:  [96.9 F (36.1 C)-97.4 F (36.3 C)] 97.4 F (36.3 C) (04/25 0329) Pulse Rate:  [64-81] 77 (04/25 0547) Resp:  [16-20] 18 (04/25 0547) BP: (108-128)/(68-72) 123/68 (04/25 0547) SpO2:  [96 %-99 %] 98 % (04/25 0329) Weight:  [258 lb 9.6 oz (117.3 kg)] 258 lb 9.6 oz (117.3 kg) (04/25 0329) Last BM Date: 01/12/17  Weight change: Filed Weights   01/11/17 0452 01/12/17 0501 01/13/17 0329  Weight: 264 lb 11.2 oz (120.1 kg) 260 lb 12.8 oz (118.3 kg) 258 lb 9.6 oz (117.3 kg)    Intake/Output:   Intake/Output Summary (Last 24 hours) at 01/13/17 0749 Last data filed at 01/13/17 0346  Gross per 24 hour  Intake           271.35 ml  Output             3275 ml  Net         -3003.65 ml     Physical Exam: General:  Chronically ill.  No resp difficulty HEENT: normal Neck: supple. no JVD. Carotids 2+ bilat; no bruits. No lymphadenopathy or thryomegaly appreciated. Cor: PMI nondisplaced. Regular rate & rhythm. No rubs, gallops or murmurs. Lungs: LLL RLL crackles. On 3 liters oxygen.  Abdomen: soft, nontender, nondistended. No hepatosplenomegaly. No bruits or masses. Good bowel sounds. Extremities: no cyanosis, clubbing, rash, Rand L 2+ edema. R and LLE compression wraps.  Neuro: alert & orientedx3, cranial nerves grossly intact. moves all 4 extremities w/o difficulty. Affect pleasant   Telemetry: Afib 60-80s NSVT . Personally reviewed.  .    Labs: Basic Metabolic Panel:  Recent Labs Lab 01/07/17 1421 01/08/17 0126 01/09/17 0421 01/10/17 0444 01/11/17 0418 01/12/17 0149 01/13/17 0306  NA  139 139 135 133* 136 136 133*  K 3.0* 3.3* 4.0 4.1 4.2 4.5 4.0  CL 99* 97* 94* 94* 95* 96* 95*  CO2 29 32 32 GLUCOSE 91 121* 89 176* 140* 151* 136*  BUN 45* 45* 46* 46* 44* 41* 39*  CREATININE 1.29* 1.44* 1.53* 1.58* 1.64* 1.51* 1.74*  CALCIUM 9.5 9.8 9.4 9.7 9.9 10.0 9.9  MG 1.7 2.1 1.9  --   --  1.9  --     Liver Function Tests:  Recent Labs Lab 01/07/17 1421  AST 26  ALT 15*  ALKPHOS 74  BILITOT 1.5*  PROT 7.5  ALBUMIN 3.2*   No results for input(s): LIPASE, AMYLASE in the last 168 hours. No results for input(s): AMMONIA in the last 168 hours.  CBC:  Recent Labs Lab 01/07/17 1421 01/13/17 0306  WBC 9.1 6.4  NEUTROABS 6.2 4.1  HGB 12.9* 12.1*  HCT 42.0 40.5  MCV 81.7 85.1  PLT 154 156    Cardiac Enzymes:  Recent Labs Lab 01/07/17 1421 01/07/17 1912 01/08/17 0126  TROPONINI 0.09* 0.06* 0.08*    BNP: BNP (last 3 results)  Recent Labs  01/07/17 1421  BNP 695.6*    ProBNP (last 3 results) No results for input(s): PROBNP in the last 8760 hours.    Other  results:  Imaging: No results found.   Medications:     Scheduled Medications: . allopurinol  100 mg Oral Daily  . aspirin EC  81 mg Oral Daily  . atorvastatin  20 mg Oral q1800  . calcitRIOL  0.25 mcg Oral Daily  . carvedilol  3.125 mg Oral BID WC  . furosemide  80 mg Intravenous BID  . glipiZIDE  5 mg Oral BID AC  . insulin aspart  0-20 Units Subcutaneous TID WC  . insulin aspart  15 Units Subcutaneous TID WC  . insulin glargine  25 Units Subcutaneous Daily  . pantoprazole  40 mg Oral BID WC  . sodium chloride flush  3 mL Intravenous Q12H  . spironolactone  25 mg Oral Daily  . Warfarin - Pharmacist Dosing Inpatient   Does not apply q1800    Infusions: . sodium chloride    . milrinone 0.25 mcg/kg/min (01/12/17 2105)    PRN Medications: sodium chloride, acetaminophen, ondansetron (ZOFRAN) IV, sodium chloride flush, traMADol   Assessment/Plan/Discussion   1.  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  - Echo with EF 25-30%. Moderate RV dysfunction  - Brisk diuresis noted. Volume status remains elevated for this reason continue milrinone until better diuresed.   Continue milrinone 0.25 mcg. Continue lasix 80 mg twice a day + diamox 250 mg twice a day.   - Continue lower dose of carvedilol. - Continue spiro 25 mg daily.  - Hold off on entresto. Creatinine trending up.  - RHC later in week once off milrinone  2. CAD  - No CP. Continue asa + statin. Recent myoview 2017  ok. Continue asa to statin 3. Chronic A fib - Rate controlled. INR 2.28.  4. Acute on CKD Stage III - Creatinine baseline 1.6-1.8. Was 1.29 on 4/19 Todays creatinine trending up 1.5>1.7.  5. . Chronic Respiratory Failure  - on  Home oxygen. O2 sats >90% 6. R and LLE partial thickness wounds - WOC consulted with recommendations. Continue conservative wound care + mild compression. Wounds are improving. No evidence of cellulitis  7. Hypokalemia:  -K 4.0  8. NSVT - Continues with runs of NSVT. Mag 1.9 yesterday. Received 2 grams mag.  - He has ICD.  Consult PT   Tonye Becket, NP  7:49 AM   Length of Stay: 6  Advanced Heart Failure Team Pager [phone] (M-F; 7a - 4p)  Please contact [facility] for night-coverage after hours (4p -7a ) and weekends on [email or web address]  Patient seen and examined with Tonye Becket, NP. We discussed all aspects of the encounter. I agree with the assessment and plan as stated above.   Volume status continues to improve but still appears overloaded. Creatinine trending up slowly. Will continue milrinone and IV diuresis one more day. Likely start inotrope wean tomorrow and proceed with RHC Thurs or Friday. Continue K and Mag for NSVT  Diuretic regimen discussed with PharmD.   Arvilla Meres, MD  9:22 AM

## 2017-01-13 NOTE — Evaluation (Signed)
Physical Therapy Evaluation Patient Details Name: Donald Dunlap MRN: [id number] DOB: [birth year] Today's Date: 01/13/2017   History of Present Illness  Pt adm with acute on chronic systolic heart failure. PMH - ICD, cabg, afib, dm, copd, ckd, cva  Clinical Impression  Pt admitted with above diagnosis and presents to PT with functional limitations due to deficits listed below (See PT problem list). Pt needs skilled PT to maximize independence and safety to allow discharge to home. Expect pt close to his baseline. Will follow acutely but doubt will need PT at DC.     Follow Up Recommendations No PT follow up    Equipment Recommendations  Other (comment) (rollator)    Recommendations for Other Services       Precautions / Restrictions Precautions Precautions: None Restrictions Weight Bearing Restrictions: No      Mobility  Bed Mobility               General bed mobility comments: Pt up in chair  Transfers Overall transfer level: Needs assistance Equipment used: None Transfers: Sit to/from Stand Sit to Stand: Min guard         General transfer comment: Incr time and effort to rise. Assist for safety  Ambulation/Gait Ambulation/Gait assistance: Supervision Ambulation Distance (Feet): 220 Feet Assistive device: 4-wheeled walker Gait Pattern/deviations: Step-through pattern;Decreased step length - right;Decreased step length - left Gait velocity: decr Gait velocity interpretation: Below normal speed for age/gender General Gait Details: Pt used rollator to decr stress on knees.   Stairs            Wheelchair Mobility    Modified Rankin (Stroke Patients Only)       Balance Overall balance assessment: Needs assistance Sitting-balance support: No upper extremity supported;Feet supported Sitting balance-Leahy Scale: Good     Standing balance support: No upper extremity supported;During functional activity Standing balance-Leahy Scale: Fair                                Pertinent Vitals/Pain Pain Assessment: No/denies pain    Home Living Family/patient expects to be discharged to:: Private residence Living Arrangements: Spouse/significant other Available Help at Discharge: Family Type of Home: House Home Access: Ramped entrance     Home Layout: One level Home Equipment: Gilmer Mor - single point Additional Comments: lift chair    Prior Function Level of Independence: Independent with assistive device(s)         Comments: amb with cane     Hand Dominance        Extremity/Trunk Assessment   Upper Extremity Assessment Upper Extremity Assessment: Overall WFL for tasks assessed    Lower Extremity Assessment Lower Extremity Assessment: RLE deficits/detail;LLE deficits/detail;Generalized weakness RLE Deficits / Details: 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 LLE Deficits / Details: 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       Communication   Communication: No difficulties  Cognition Arousal/Alertness: Awake/alert Behavior During Therapy: WFL for tasks assessed/performed Overall Cognitive Status: Within Functional Limits for tasks assessed                                        General Comments      Exercises     Assessment/Plan    PT Assessment Patient needs continued PT services  PT Problem List Decreased strength;Decreased activity tolerance;Decreased balance;Decreased mobility;Obesity;Pain  PT Treatment Interventions DME instruction;Gait training;Functional mobility training;Therapeutic activities;Therapeutic exercise;Balance training;Patient/family education    PT Goals (Current goals can be found in the Care Plan section)  Acute Rehab PT Goals Patient Stated Goal: return home PT Goal Formulation: With patient Time For Goal Achievement: 01/20/17 Potential to Achieve Goals: Good    Frequency Min 2X/week   Barriers to discharge        Co-evaluation                End of Session   Activity Tolerance: Patient tolerated treatment well Patient left: in chair;with call bell/phone within reach   PT Visit Diagnosis: Other abnormalities of gait and mobility (R26.89)    Time: 4098-1191 PT Time Calculation (min) (ACUTE ONLY): 18 min   Charges:   PT Evaluation $PT Eval Low Complexity: 1 Procedure     PT G CodesMarland Kitchen        [facility] PT [phone]   Angelina Ok [facility] : [facility] 01/13/2017, 1:06 PM

## 2017-01-13 NOTE — Progress Notes (Addendum)
ANTICOAGULATION CONSULT NOTE - Follow Up Consult  Pharmacy Consult for Coumadin Indication: atrial fibrillation  Allergies  Allergen Reactions  . Latex     Rash   . Oxycodone     hallucinations    Patient Measurements: Height:  (175.3 cm) Weight: 258 lb 9.6 oz (117.3 kg) IBW/kg (Calculated) : 70.7  Vital Signs: Temp: 97.4 F (36.3 C) (04/25 0329) Temp Source: Oral (04/25 0329) BP: 123/68 (04/25 0547) Pulse Rate: 77 (04/25 0547)  Labs:  Recent Labs  01/11/17 0418 01/12/17 0149 01/13/17 0306  HGB  --   --  12.1*  HCT  --   --  40.5  PLT  --   --  156  LABPROT 24.4* 25.8* 25.5*  INR 2.16 2.32 2.28  CREATININE 1.64* 1.51* 1.74*    Estimated Creatinine Clearance: 53.5 mL/min (A) (by C-G formula based on SCr of 1.74 mg/dL (H)).  Assessment: 65yom continues on coumadin for afib. INR previously slightly above goal peak at 3.18, then down to 2.16 after 4 days of reduced doses -then bump up 2.3 with home dose, remains at 2.3 with lower dose last pm - will adjust scheduled does as lower than PTA H/H stable plt wnl. No bleeding documented.  PTA dose:  daily except  Mon/Fri  Goal of Therapy:  INR 2-3  Monitor platelets by anticoagulation protocol: Yes   Plan:  1) Coumadin  MWF/2mg  TTSS 2) Daily INR 3) Monitor CBC, s/sx bleeding   Leota Sauers Pharm.D. CPP, BCPS Clinical Pharmacist [phone] 01/13/2017 12:24 PM

## 2017-01-14 LAB — BASIC METABOLIC PANEL
ANION GAP: 9 (ref 5–15)
BUN: 35 mg/dL — AB (ref 6–20)
CO2: 28 mmol/L (ref 22–32)
Calcium: 10.1 mg/dL (ref 8.9–10.3)
Chloride: 94 mmol/L — ABNORMAL LOW (ref 101–111)
Creatinine, Ser: 1.77 mg/dL — ABNORMAL HIGH (ref 0.61–1.24)
GFR, EST AFRICAN AMERICAN: 45 mL/min — AB (ref >60)
GFR, EST NON AFRICAN AMERICAN: 39 mL/min — AB (ref >60)
Glucose, Bld: 158 mg/dL — ABNORMAL HIGH (ref 65–99)
POTASSIUM: 4.1 mmol/L (ref 3.5–5.1)
SODIUM: 131 mmol/L — AB (ref 135–145)

## 2017-01-14 LAB — MAGNESIUM: MAGNESIUM: 2 mg/dL (ref 1.7–2.4)

## 2017-01-14 LAB — GLUCOSE, CAPILLARY
GLUCOSE-CAPILLARY: 116 mg/dL — AB (ref 65–99)
GLUCOSE-CAPILLARY: 176 mg/dL — AB (ref 65–99)
GLUCOSE-CAPILLARY: 83 mg/dL (ref 65–99)
GLUCOSE-CAPILLARY: 201 mg/dL — AB (ref 65–99)
Glucose-Capillary: 143 mg/dL — ABNORMAL HIGH (ref 65–99)
Glucose-Capillary: 59 mg/dL — ABNORMAL LOW (ref 65–99)

## 2017-01-14 LAB — PROTIME-INR
INR: 2.35
Prothrombin Time: 26.2 seconds — ABNORMAL HIGH (ref 11.4–15.2)

## 2017-01-14 MED ORDER — SODIUM CHLORIDE 0.9 % IV SOLN
INTRAVENOUS | Status: DC
  Administered 2017-01-15: via INTRAVENOUS

## 2017-01-14 MED ORDER — TORSEMIDE 20 MG PO TABS
40.0000 mg | ORAL_TABLET | Freq: Two times a day (BID) | ORAL | Status: DC
  Administered 2017-01-14 – 2017-01-16 (×4): 40 mg via ORAL
  Filled 2017-01-14 (×4): qty 2

## 2017-01-14 MED ORDER — ASPIRIN 81 MG PO CHEW
81.0000 mg | CHEWABLE_TABLET | ORAL | Status: AC
  Administered 2017-01-15: 81 mg via ORAL
  Filled 2017-01-14: qty 1

## 2017-01-14 MED ORDER — SODIUM CHLORIDE 0.9% FLUSH: 3.0000 mL | INTRAVENOUS | Status: DC | PRN

## 2017-01-14 MED ORDER — SODIUM CHLORIDE 0.9% FLUSH
3.0000 mL | Freq: Two times a day (BID) | INTRAVENOUS | Status: DC
  Administered 2017-01-14: 3 mL via INTRAVENOUS

## 2017-01-14 MED ORDER — SODIUM CHLORIDE 0.9 % IV SOLN: 250.0000 mL | INTRAVENOUS | Status: DC | PRN

## 2017-01-14 NOTE — Progress Notes (Signed)
Hypoglycemic Event  CBG:59  Treatment: orange juce  Symptoms: weakness  Follow-up CBG: Time:1640 CBG Result:83  Possible Reasons for Event:disease process Comments/MD notified: Dory Peru NP paged awaiting response, will continue to monitor    [facility] R Garner Dullea

## 2017-01-14 NOTE — Progress Notes (Signed)
Wound dressing changed as ordered.

## 2017-01-14 NOTE — Progress Notes (Signed)
ANTICOAGULATION CONSULT NOTE - Follow Up Consult  Pharmacy Consult for Coumadin Indication: atrial fibrillation  Allergies  Allergen Reactions  . Latex     Rash   . Oxycodone     hallucinations    Patient Measurements: Height:  (175.3 cm) Weight: 259 lb (117.5 kg) IBW/kg (Calculated) : 70.7  Vital Signs: Temp: 97.5 F (36.4 C) (04/26 0841) Temp Source: Axillary (04/26 0841) BP: 100/63 (04/26 0841) Pulse Rate: 86 (04/26 0841)  Labs:  Recent Labs  01/12/17 0149 01/13/17 0306 01/14/17 0235  HGB  --  12.1*  --   HCT  --  40.5  --   PLT  --  156  --   LABPROT 25.8* 25.5* 26.2*  INR 2.32 2.28 2.35  CREATININE 1.51* 1.74* 1.77*    Estimated Creatinine Clearance: 52.6 mL/min (A) (by C-G formula based on SCr of 1.77 mg/dL (H)).  Assessment: 65yom continues on coumadin for afib. INR previously slightly above goal with peak at 3.18, then down to 2.16 after 4 days of reduced doses, then bump up to 2.3 with home dose. Regimen adjusted yesterday to give lower doses than PTA. INR at goal 2.35 today.  PTA dose:  daily except  Mon/Fri  Goal of Therapy:  INR 2-3  Monitor platelets by anticoagulation protocol: Yes   Plan:  1) Continue coumadin  MWF/2mg  TTSS 2) Daily INR   Louie Casa, PharmD, BCPS 01/14/2017 12:02 PM

## 2017-01-14 NOTE — Progress Notes (Signed)
Advanced Heart Failure Rounding Note   Subjective:    Milrinone 0.25 added 4/21 for sluggish diuresis. Continues to diurese with IV lasix. Diamox added 4/22 with good results.  Weight up 1 pound. Negative over 3 liters. PT consulted. Ambulated 220 feet.   Ongoing dyspnea with exertion.     Objective:   Weight Range:  Vital Signs:   Temp:  [97.4 F (36.3 C)-97.5 F (36.4 C)] 97.5 F (36.4 C) (04/26 0353) Pulse Rate:  [78-90] 90 (04/26 0353) Resp:  [18] 18 (04/26 0353) BP: (100-124)/(60-69) 124/61 (04/26 0353) SpO2:  [97 %-100 %] 99 % (04/26 0353) Weight:  [259 lb (117.5 kg)] 259 lb (117.5 kg) (04/26 0353) Last BM Date: 01/13/17  Weight change: Filed Weights   01/12/17 0501 01/13/17 0329 01/14/17 0353  Weight: 260 lb 12.8 oz (118.3 kg) 258 lb 9.6 oz (117.3 kg) 259 lb (117.5 kg)    Intake/Output:   Intake/Output Summary (Last 24 hours) at 01/14/17 0752 Last data filed at 01/14/17 6213  Gross per 24 hour  Intake                0 ml  Output             3375 ml  Net            -3375 ml     Physical Exam: General:  Chronically ill appearing. No resp difficulty HEENT: normal Neck: supple. JVP ~10 . Carotids 2+ bilat; no bruits. No lymphadenopathy or thryomegaly appreciated. Cor: PMI nondisplaced. Irregular rate & rhythm. No rubs, gallops or murmurs. Lungs: RML RLL LLL crackles 3 liters.  Abdomen: soft, nontender, nondistended. No hepatosplenomegaly. No bruits or masses. Good bowel sounds. Extremities: no cyanosis, clubbing, rash, R and LLE compression wraps in place. 1+ edema. edema Neuro: alert & orientedx3, cranial nerves grossly intact. moves all 4 extremities w/o difficulty. Affect pleasant    Telemetry: A fib 70-90s PVCs. Personally reviewed.   .    Labs: Basic Metabolic Panel:  Recent Labs Lab 01/07/17 1421 01/08/17 0126 01/09/17 0421 01/10/17 0444 01/11/17 0418 01/12/17 0149 01/13/17 0306 01/14/17 0235  NA 139 139 135 133* 136 136 133* 131*   K 3.0* 3.3* 4.0 4.1 4.2 4.5 4.0 4.1  CL 99* 97* 94* 94* 95* 96* 95* 94*  CO2 29 32 32 GLUCOSE 91 121* 89 176* 140* 151* 136* 158*  BUN 45* 45* 46* 46* 44* 41* 39* 35*  CREATININE 1.29* 1.44* 1.53* 1.58* 1.64* 1.51* 1.74* 1.77*  CALCIUM 9.5 9.8 9.4 9.7 9.9 10.0 9.9 10.1  MG 1.7 2.1 1.9  --   --  1.9  --  2.0    Liver Function Tests:  Recent Labs Lab 01/07/17 1421  AST 26  ALT 15*  ALKPHOS 74  BILITOT 1.5*  PROT 7.5  ALBUMIN 3.2*   No results for input(s): LIPASE, AMYLASE in the last 168 hours. No results for input(s): AMMONIA in the last 168 hours.  CBC:  Recent Labs Lab 01/07/17 1421 01/13/17 0306  WBC 9.1 6.4  NEUTROABS 6.2 4.1  HGB 12.9* 12.1*  HCT 42.0 40.5  MCV 81.7 85.1  PLT 154 156    Cardiac Enzymes:  Recent Labs Lab 01/07/17 1421 01/07/17 1912 01/08/17 0126  TROPONINI 0.09* 0.06* 0.08*    BNP: BNP (last 3 results)  Recent Labs  01/07/17 1421  BNP 695.6*    ProBNP (last 3 results) No results for input(s): PROBNP in the  last 8760 hours.    Other results:  Imaging: No results found.   Medications:     Scheduled Medications: . allopurinol  100 mg Oral Daily  . aspirin EC  81 mg Oral Daily  . atorvastatin  20 mg Oral q1800  . calcitRIOL  0.25 mcg Oral Daily  . carvedilol  3.125 mg Oral BID WC  . furosemide  80 mg Intravenous BID  . glipiZIDE  5 mg Oral BID AC  . insulin aspart  0-20 Units Subcutaneous TID WC  . insulin aspart  15 Units Subcutaneous TID WC  . insulin glargine  25 Units Subcutaneous Daily  . pantoprazole  40 mg Oral BID WC  . sodium chloride flush  3 mL Intravenous Q12H  . spironolactone  25 mg Oral Daily  . warfarin  2 mg Oral Q T,Th,S,Su-1800  . warfarin  4 mg Oral Q M,W,F-1800  . Warfarin - Pharmacist Dosing Inpatient   Does not apply q1800    Infusions: . sodium chloride    . milrinone 0.25 mcg/kg/min (01/14/17 0605)    PRN Medications: sodium chloride, acetaminophen, ondansetron  (ZOFRAN) IV, sodium chloride flush, traMADol   Assessment/Plan/Discussion   1. 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  - Echo with EF 25-30%. Moderate RV dysfunction  - Diuresed over 3 liters and he had 1 pound weigh gain.  - Volume status is improving. Stop milrinone.  -Stop IV lasix, metolazone, diamox.  -Start torsemide 40 mg twice a day.  -Continue lower dose of carvedilol. - Continue spiro 25 mg daily.  - Hold off on entresto.   - RHC tomorrow.   2. CAD  -No CP.  Continue asa + statin. Recent myoview 2017  ok. Continue asa to statin 3. Chronic A fib - Rate controlled.  INR 2.35   4. Acute on CKD Stage III - Creatinine baseline 1.6-1.8. Was 1.29 on 4/19 Todays creatinine trending up 1.5>1.7>1.77.  5. . Chronic Respiratory Failure  - on  Home oxygen. O2 sats >90% 6. R and LLE partial thickness wounds - WOC consulted with recommendations. Continue conservative wound care + mild compression. Wounds are improving. No evidence of cellulitis  7. Hypokalemia:  -K 4.1  8. NSVT - Continues with runs of NSVT. Mag 2.0.  He has ICD.   Tonye Becket, NP  7:52 AM   Length of Stay: 7  Advanced Heart Failure Team Pager [phone] (M-F; 7a - 4p)  Please contact [facility] for night-coverage after hours (4p -7a ) and weekends on [email or web address]  Patient seen and examined with Tonye Becket, NP. We discussed all aspects of the encounter. I agree with the assessment and plan as stated above.   On milrinone. Diuresing but weight unchanged. Remains dyspneic. Creatinine getting worse.   Will hold milrinone today and proceed with RHC tomorrow am.   AF rate controlled. INR ok. Discussed with PharmD.   Arvilla Meres, MD  2:38 PM

## 2017-01-15 ENCOUNTER — Encounter (HOSPITAL_COMMUNITY): Payer: Self-pay | Admitting: Internal Medicine

## 2017-01-15 ENCOUNTER — Inpatient Hospital Stay (HOSPITAL_COMMUNITY): Payer: Medicare Other

## 2017-01-15 ENCOUNTER — Encounter (HOSPITAL_COMMUNITY)
Admission: AD | Disposition: A | Discharge status: Home Health | Payer: Self-pay | Source: Ambulatory Visit | Attending: Internal Medicine

## 2017-01-15 HISTORY — PX: RIGHT HEART CATH: CATH118263

## 2017-01-15 LAB — COOXEMETRY PANEL
CARBOXYHEMOGLOBIN: 1.7 % — AB (ref 0.5–1.5)
Methemoglobin: 1.3 % (ref 0.0–1.5)
O2 SAT: 67.3 %
TOTAL HEMOGLOBIN: 13.5 g/dL (ref 12.0–16.0)

## 2017-01-15 LAB — POCT I-STAT 3, VENOUS BLOOD GAS (G3P V)
ACID-BASE EXCESS: 2 mmol/L (ref 0.0–2.0)
ACID-BASE EXCESS: 3 mmol/L — AB (ref 0.0–2.0)
Bicarbonate: 30.8 mmol/L — ABNORMAL HIGH (ref 20.0–28.0)
Bicarbonate: 31.3 mmol/L — ABNORMAL HIGH (ref 20.0–28.0)
O2 SAT: 48 %
O2 SAT: 50 %
PCO2 VEN: 63.6 mmHg — AB (ref 44.0–60.0)
PO2 VEN: 30 mmHg — AB (ref 32.0–45.0)
TCO2: 33 mmol/L (ref 0–100)
TCO2: 33 mmol/L (ref 0–100)
pCO2, Ven: 63 mmHg — ABNORMAL HIGH (ref 44.0–60.0)
pH, Ven: 7.298 (ref 7.250–7.430)
pH, Ven: 7.3 (ref 7.250–7.430)
pO2, Ven: 31 mmHg — CL (ref 32.0–45.0)

## 2017-01-15 LAB — GLUCOSE, CAPILLARY
GLUCOSE-CAPILLARY: 159 mg/dL — AB (ref 65–99)
GLUCOSE-CAPILLARY: 150 mg/dL — AB (ref 65–99)
Glucose-Capillary: 146 mg/dL — ABNORMAL HIGH (ref 65–99)
Glucose-Capillary: 137 mg/dL — ABNORMAL HIGH (ref 65–99)
Glucose-Capillary: 125 mg/dL — ABNORMAL HIGH (ref 65–99)

## 2017-01-15 LAB — PROTIME-INR
INR: 2.26
PROTHROMBIN TIME: 25.3 s — AB (ref 11.4–15.2)

## 2017-01-15 LAB — BASIC METABOLIC PANEL
Anion gap: 10 (ref 5–15)
BUN: 39 mg/dL — AB (ref 6–20)
CALCIUM: 9.9 mg/dL (ref 8.9–10.3)
CO2: 27 mmol/L (ref 22–32)
Chloride: 95 mmol/L — ABNORMAL LOW (ref 101–111)
Creatinine, Ser: 1.74 mg/dL — ABNORMAL HIGH (ref 0.61–1.24)
GFR calc Af Amer: 46 mL/min — ABNORMAL LOW (ref >60)
GFR, EST NON AFRICAN AMERICAN: 39 mL/min — AB (ref >60)
GLUCOSE: 160 mg/dL — AB (ref 65–99)
Potassium: 4.5 mmol/L (ref 3.5–5.1)
Sodium: 132 mmol/L — ABNORMAL LOW (ref 135–145)

## 2017-01-15 LAB — MRSA PCR SCREENING: MRSA by PCR: NEGATIVE

## 2017-01-15 SURGERY — RIGHT HEART CATH
Anesthesia: LOCAL

## 2017-01-15 MED ORDER — LIDOCAINE HCL (PF) 1 % IJ SOLN
INTRAMUSCULAR | Status: AC
  Filled 2017-01-15: qty 30

## 2017-01-15 MED ORDER — HEPARIN (PORCINE) IN NACL 2-0.9 UNIT/ML-% IJ SOLN
INTRAMUSCULAR | Status: DC | PRN
  Administered 2017-01-15: 1000 mL

## 2017-01-15 MED ORDER — SODIUM CHLORIDE 0.9% FLUSH
10.0000 mL | Freq: Two times a day (BID) | INTRAVENOUS | Status: DC
  Administered 2017-01-15 – 2017-01-20 (×7): 10 mL

## 2017-01-15 MED ORDER — ACETAZOLAMIDE ER 500 MG PO CP12
500.0000 mg | ORAL_CAPSULE | Freq: Two times a day (BID) | ORAL | Status: DC
  Administered 2017-01-15 – 2017-01-18 (×7): 500 mg via ORAL
  Filled 2017-01-15 (×7): qty 1

## 2017-01-15 MED ORDER — SODIUM CHLORIDE 0.9% FLUSH
10.0000 mL | INTRAVENOUS | Status: DC | PRN
  Administered 2017-01-15: 10 mL
  Filled 2017-01-15: qty 40

## 2017-01-15 MED ORDER — MILRINONE LACTATE IN DEXTROSE 20-5 MG/100ML-% IV SOLN
0.2500 ug/kg/min | INTRAVENOUS | Status: DC
  Administered 2017-01-15 – 2017-01-21 (×13): 0.25 ug/kg/min via INTRAVENOUS
  Filled 2017-01-15 (×13): qty 100

## 2017-01-15 MED ORDER — LIDOCAINE HCL (PF) 1 % IJ SOLN
INTRAMUSCULAR | Status: DC | PRN
  Administered 2017-01-15: 10 mL
  Administered 2017-01-15: 2 mL
  Administered 2017-01-15: 10 mL

## 2017-01-15 MED ORDER — HEPARIN (PORCINE) IN NACL 2-0.9 UNIT/ML-% IJ SOLN
INTRAMUSCULAR | Status: AC
  Filled 2017-01-15: qty 1000

## 2017-01-15 MED ORDER — FUROSEMIDE 10 MG/ML IJ SOLN
80.0000 mg | Freq: Two times a day (BID) | INTRAMUSCULAR | Status: DC
  Administered 2017-01-15 – 2017-01-19 (×8): 80 mg via INTRAVENOUS
  Filled 2017-01-15 (×8): qty 8

## 2017-01-15 SURGICAL SUPPLY — 9 items
CATH SWAN GANZ 7F STRAIGHT (CATHETERS) ×2 IMPLANT
COVER PRB 48X5XTLSCP FOLD TPE (BAG) ×1 IMPLANT
COVER PROBE 5X48 (BAG) ×1
KIT HEART LEFT (KITS) ×2 IMPLANT
PACK CARDIAC CATHETERIZATION (CUSTOM PROCEDURE TRAY) ×2 IMPLANT
SHEATH GLIDE SLENDER 4/5FR (SHEATH) ×2 IMPLANT
SHEATH PINNACLE 7F 10CM (SHEATH) ×2 IMPLANT
TRANSDUCER W/STOPCOCK (MISCELLANEOUS) ×2 IMPLANT
TUBING CIL FLEX 10 FLL-RA (TUBING) ×2 IMPLANT

## 2017-01-15 NOTE — Progress Notes (Signed)
PT Cancellation Note  Patient Details Name: Donald Dunlap MRN: [id number] DOB: 07-28-1951   Cancelled Treatment:    Reason Eval/Treat Not Completed: Patient at procedure or test/unavailable. Pt with sterile procedure in progress.   [name] 01/15/2017, 3:19 PM [employer] PT [phone]

## 2017-01-15 NOTE — Progress Notes (Signed)
Advanced Heart Failure Rounding Note   Subjective:    Off milrinone. Diuresis has slowed. Weight down 1 pound (8 pounds total). Still SOB with exertion. No orthopnea.   Creatinine stable at 1.7  INR 2.2  Objective:   Weight Range:  Vital Signs:   Temp:  [97.4 F (36.3 C)-97.5 F (36.4 C)] 97.4 F (36.3 C) (04/27 0449) Pulse Rate:  [68-86] 84 (04/27 0449) Resp:  [18] 18 (04/27 0449) BP: (98-115)/(63-73) 109/69 (04/27 0449) SpO2:  [97 %-100 %] 100 % (04/27 0449) Weight:  [117.3 kg (258 lb 8 oz)] 117.3 kg (258 lb 8 oz) (04/27 0449) Last BM Date: 01/13/17  Weight change: Filed Weights   01/13/17 0329 01/14/17 0353 01/15/17 0449  Weight: 117.3 kg (258 lb 9.6 oz) 117.5 kg (259 lb) 117.3 kg (258 lb 8 oz)    Intake/Output:   Intake/Output Summary (Last 24 hours) at 01/15/17 0715 Last data filed at 01/15/17 0539  Gross per 24 hour  Intake             1240 ml  Output             1600 ml  Net             -360 ml     Physical Exam: General:  Chronically ill appearing. Lying in bed No resp difficulty HEENT: normal poor dentition Neck: supple. JVP 8-9 . Carotids 2+ bilat; no bruits. No lymphadenopathy or thryomegaly appreciated. Cor: PMI nondisplaced. Irregular rate & rhythm. No murmur Lungs: Decreased throughout  No wheeze Abdomen: soft, NT/ND. No hepatosplenomegaly. No bruits or masses. Good bowel sounds. Extremities: no cyanosis, clubbing, rash, R and LLE compression wraps in place. 1+ edema. Warm  Neuro: alert & orientedx3, cranial nerves grossly intact. moves all 4 extremities w/o difficulty. Affect pleasant    Telemetry: A fib 80s Personally reviewed  .    Labs: Basic Metabolic Panel:  Recent Labs Lab 01/09/17 0421  01/11/17 0418 01/12/17 0149 01/13/17 0306 01/14/17 0235 01/15/17 0422  NA 135  < > 136 136 133* 131* 132*  K 4.0  < > 4.2 4.5 4.0 4.1 4.5  CL 94*  < > 95* 96* 95* 94* 95*  CO2 32  < > GLUCOSE 89  < > 140* 151* 136* 158*  160*  BUN 46*  < > 44* 41* 39* 35* 39*  CREATININE 1.53*  < > 1.64* 1.51* 1.74* 1.77* 1.74*  CALCIUM 9.4  < > 9.9 10.0 9.9 10.1 9.9  MG 1.9  --   --  1.9  --  2.0  --   < > = values in this interval not displayed.  Liver Function Tests: No results for input(s): AST, ALT, ALKPHOS, BILITOT, PROT, ALBUMIN in the last 168 hours. No results for input(s): LIPASE, AMYLASE in the last 168 hours. No results for input(s): AMMONIA in the last 168 hours.  CBC:  Recent Labs Lab 01/13/17 0306  WBC 6.4  NEUTROABS 4.1  HGB 12.1*  HCT 40.5  MCV 85.1  PLT 156    Cardiac Enzymes: No results for input(s): CKTOTAL, CKMB, CKMBINDEX, TROPONINI in the last 168 hours.  BNP: BNP (last 3 results)  Recent Labs  01/07/17 1421  BNP 695.6*    ProBNP (last 3 results) No results for input(s): PROBNP in the last 8760 hours.    Other results:  Imaging: No results found.   Medications:     Scheduled Medications: . allopurinol  100 mg Oral Daily  . aspirin EC  81 mg Oral Daily  . atorvastatin  20 mg Oral q1800  . calcitRIOL  0.25 mcg Oral Daily  . carvedilol  3.125 mg Oral BID WC  . glipiZIDE  5 mg Oral BID AC  . insulin aspart  0-20 Units Subcutaneous TID WC  . insulin aspart  15 Units Subcutaneous TID WC  . insulin glargine  25 Units Subcutaneous Daily  . pantoprazole  40 mg Oral BID WC  . sodium chloride flush  3 mL Intravenous Q12H  . sodium chloride flush  3 mL Intravenous Q12H  . spironolactone  25 mg Oral Daily  . torsemide  40 mg Oral BID  . warfarin  2 mg Oral Q T,Th,S,Su-1800  . warfarin  4 mg Oral Q M,W,F-1800  . Warfarin - Pharmacist Dosing Inpatient   Does not apply q1800    Infusions: . sodium chloride    . sodium chloride    . sodium chloride 10 mL/hr at 01/15/17 0000    PRN Medications: sodium chloride, sodium chloride, acetaminophen, ondansetron (ZOFRAN) IV, sodium chloride flush, sodium chloride flush, traMADol   Assessment/Plan/Discussion   1. A/C  Systolic Heart Failure / Medtronic ICD.  - Echo with EF 25-30%. Moderate RV dysfunction  - Weight down 8 pounds. Now off milrinone.  - Remains NYHA IIIB - Plan RHC to further evaluate - IV lasix switched to po torsemide 40 bid (was on lasix at home) - Possible addition of Entresto - Continue low dos b-blocker 2. CAD  -No angina.  Recent myoview 2017  ok. Continue asa to statin 3. Chronic A fib - Rate controlled.  INR 2.2 4. Acute on CKD Stage III - Creatinine baseline 1.6-1.8. Was 1.29 on 4/19 -Creatinine stable today 1.7.  5. . Chronic Respiratory Failure  - on  Home oxygen. O2 sats >90% 6. R and LLE partial thickness wounds - WOC consulted with recommendations. Continue conservative wound care + mild compression. Wounds are improving. No evidence of cellulitis  7. Hypokalemia:  -K 4.5 can reduce K supp   8. NSVT - Occasiona runs of NSVT. Mag 2.0.  He has ICD.   Arvilla Meres, MD  7:15 AM  Length of Stay: 8 Advanced Heart Failure Team Pager [phone] (M-F; 7a - 4p)  Please contact [facility] for night-coverage after hours (4p -7a ) and weekends on [email or web address]

## 2017-01-15 NOTE — Progress Notes (Signed)
ANTICOAGULATION CONSULT NOTE - Follow Up Consult  Pharmacy Consult for Coumadin Indication: atrial fibrillation  Allergies  Allergen Reactions  . Latex     Rash   . Oxycodone     hallucinations    Patient Measurements: Height:  (175.3 cm) Weight: 258 lb 8 oz (117.3 kg) IBW/kg (Calculated) : 70.7  Vital Signs: Temp: 97.4 F (36.3 C) (04/27 0449) Temp Source: Oral (04/27 0449) BP: 113/77 (04/27 0925) Pulse Rate: 86 (04/27 0925)  Labs:  Recent Labs  01/13/17 0306 01/14/17 0235 01/15/17 0422  HGB 12.1*  --   --   HCT 40.5  --   --   PLT 156  --   --   LABPROT 25.5* 26.2* 25.3*  INR 2.28 2.35 2.26  CREATININE 1.74* 1.77* 1.74*    Estimated Creatinine Clearance: 53.5 mL/min (A) (by C-G formula based on SCr of 1.74 mg/dL (H)).  Assessment: Donald Dunlap continues on coumadin for afib. INR previously slightly above goal with peak at 3.18, then down to 2.16 after 4 days of reduced doses, then bump up to 2.3 with home dose. Regimen adjusted 4/25 to give lower doses than PTA. INR at goal 2.26 today.  PTA dose:  daily except  Mon/Fri  Goal of Therapy:  INR 2-3  Monitor platelets by anticoagulation protocol: Yes   Plan:  1) Continue coumadin  MWF/2mg  TTSS 2) Daily INR   Louie Casa, PharmD, BCPS 01/15/2017 11:01 AM

## 2017-01-15 NOTE — Progress Notes (Signed)
Peripherally Inserted Central Catheter/Midline Placement  The IV Nurse has discussed with the patient and/or persons authorized to consent for the patient, the purpose of this procedure and the potential benefits and risks involved with this procedure.  The benefits include less needle sticks, lab draws from the catheter, and the patient may be discharged home with the catheter. Risks include, but not limited to, infection, bleeding, blood clot (thrombus formation), and puncture of an artery; nerve damage and irregular heartbeat and possibility to perform a PICC exchange if needed/ordered by physician.  Alternatives to this procedure were also discussed.  Bard Power PICC patient education guide, fact sheet on infection prevention and patient information card has been provided to patient /or left at bedside.    PICC/Midline Placement Documentation  PICC Double Lumen 01/15/17 PICC Right Basilic 46 cm 0 cm (Active)       Eugene Isadore, Lajean Manes 01/15/2017, 3:45 PM

## 2017-01-15 NOTE — Progress Notes (Signed)
Pt had 5 beat run of V-tach. He's alert and oriented and sitting in chair. Denies complaints of any pain or chest pain. Pt is resting comfortably. Will continue to monitor pt closely.

## 2017-01-15 NOTE — Progress Notes (Signed)
Sheath pull note: 7 FR venous sheath removed from right femoral vein. Manual pressure held for 15 minutes to achieve hemostasis. VSS throughout. Patient education given and patient acknowledged understanding. Will continue to monitor.

## 2017-01-15 NOTE — H&P (View-Only) (Signed)
Advanced Heart Failure Rounding Note   Subjective:    Off milrinone. Diuresis has slowed. Weight down 1 pound (8 pounds total). Still SOB with exertion. No orthopnea.   Creatinine stable at 1.7  INR 2.2  Objective:   Weight Range:  Vital Signs:   Temp:  [97.4 F (36.3 C)-97.5 F (36.4 C)] 97.4 F (36.3 C) (04/27 0449) Pulse Rate:  [68-86] 84 (04/27 0449) Resp:  [18] 18 (04/27 0449) BP: (98-115)/(63-73) 109/69 (04/27 0449) SpO2:  [97 %-100 %] 100 % (04/27 0449) Weight:  [117.3 kg (258 lb 8 oz)] 117.3 kg (258 lb 8 oz) (04/27 0449) Last BM Date: 01/13/17  Weight change: Filed Weights   01/13/17 0329 01/14/17 0353 01/15/17 0449  Weight: 117.3 kg (258 lb 9.6 oz) 117.5 kg (259 lb) 117.3 kg (258 lb 8 oz)    Intake/Output:   Intake/Output Summary (Last 24 hours) at 01/15/17 0715 Last data filed at 01/15/17 0539  Gross per 24 hour  Intake             1240 ml  Output             1600 ml  Net             -360 ml     Physical Exam: General:  Chronically ill appearing. Lying in bed No resp difficulty HEENT: normal poor dentition Neck: supple. JVP 8-9 . Carotids 2+ bilat; no bruits. No lymphadenopathy or thryomegaly appreciated. Cor: PMI nondisplaced. Irregular rate & rhythm. No murmur Lungs: Decreased throughout  No wheeze Abdomen: soft, NT/ND. No hepatosplenomegaly. No bruits or masses. Good bowel sounds. Extremities: no cyanosis, clubbing, rash, R and LLE compression wraps in place. 1+ edema. Warm  Neuro: alert & orientedx3, cranial nerves grossly intact. moves all 4 extremities w/o difficulty. Affect pleasant    Telemetry: A fib 80s Personally reviewed  .    Labs: Basic Metabolic Panel:  Recent Labs Lab 01/09/17 0421  01/11/17 0418 01/12/17 0149 01/13/17 0306 01/14/17 0235 01/15/17 0422  NA 135  < > 136 136 133* 131* 132*  K 4.0  < > 4.2 4.5 4.0 4.1 4.5  CL 94*  < > 95* 96* 95* 94* 95*  CO2 32  < > GLUCOSE 89  < > 140* 151* 136* 158*  160*  BUN 46*  < > 44* 41* 39* 35* 39*  CREATININE 1.53*  < > 1.64* 1.51* 1.74* 1.77* 1.74*  CALCIUM 9.4  < > 9.9 10.0 9.9 10.1 9.9  MG 1.9  --   --  1.9  --  2.0  --   < > = values in this interval not displayed.  Liver Function Tests: No results for input(s): AST, ALT, ALKPHOS, BILITOT, PROT, ALBUMIN in the last 168 hours. No results for input(s): LIPASE, AMYLASE in the last 168 hours. No results for input(s): AMMONIA in the last 168 hours.  CBC:  Recent Labs Lab 01/13/17 0306  WBC 6.4  NEUTROABS 4.1  HGB 12.1*  HCT 40.5  MCV 85.1  PLT 156    Cardiac Enzymes: No results for input(s): CKTOTAL, CKMB, CKMBINDEX, TROPONINI in the last 168 hours.  BNP: BNP (last 3 results)  Recent Labs  01/07/17 1421  BNP 695.6*    ProBNP (last 3 results) No results for input(s): PROBNP in the last 8760 hours.    Other results:  Imaging: No results found.   Medications:     Scheduled Medications: . allopurinol  100 mg Oral Daily  . aspirin EC  81 mg Oral Daily  . atorvastatin  20 mg Oral q1800  . calcitRIOL  0.25 mcg Oral Daily  . carvedilol  3.125 mg Oral BID WC  . glipiZIDE  5 mg Oral BID AC  . insulin aspart  0-20 Units Subcutaneous TID WC  . insulin aspart  15 Units Subcutaneous TID WC  . insulin glargine  25 Units Subcutaneous Daily  . pantoprazole  40 mg Oral BID WC  . sodium chloride flush  3 mL Intravenous Q12H  . sodium chloride flush  3 mL Intravenous Q12H  . spironolactone  25 mg Oral Daily  . torsemide  40 mg Oral BID  . warfarin  2 mg Oral Q T,Th,S,Su-1800  . warfarin  4 mg Oral Q M,W,F-1800  . Warfarin - Pharmacist Dosing Inpatient   Does not apply q1800    Infusions: . sodium chloride    . sodium chloride    . sodium chloride 10 mL/hr at 01/15/17 0000    PRN Medications: sodium chloride, sodium chloride, acetaminophen, ondansetron (ZOFRAN) IV, sodium chloride flush, sodium chloride flush, traMADol   Assessment/Plan/Discussion   1. A/C  Systolic Heart Failure / Medtronic ICD.  - Echo with EF 25-30%. Moderate RV dysfunction  - Weight down 8 pounds. Now off milrinone.  - Remains NYHA IIIB - Plan RHC to further evaluate - IV lasix switched to po torsemide 40 bid (was on lasix at home) - Possible addition of Entresto - Continue low dos b-blocker 2. CAD  -No angina.  Recent myoview 2017  ok. Continue asa to statin 3. Chronic A fib - Rate controlled.  INR 2.2 4. Acute on CKD Stage III - Creatinine baseline 1.6-1.8. Was 1.29 on 4/19 -Creatinine stable today 1.7.  5. . Chronic Respiratory Failure  - on  Home oxygen. O2 sats >90% 6. R and LLE partial thickness wounds - WOC consulted with recommendations. Continue conservative wound care + mild compression. Wounds are improving. No evidence of cellulitis  7. Hypokalemia:  -K 4.5 can reduce K supp   8. NSVT - Occasiona runs of NSVT. Mag 2.0.  He has ICD.   Donald Amendola, MD  7:15 AM  Length of Stay: 8 Advanced Heart Failure Team Pager [phone] (M-F; 7a - 4p)  Please contact [facility] for night-coverage after hours (4p -7a ) and weekends on [email or web address]    

## 2017-01-15 NOTE — Interval H&P Note (Signed)
History and Physical Interval Note:  01/15/2017 7:22 AM  Donald Dunlap  has presented today for surgery, with the diagnosis of hf  The various methods of treatment have been discussed with the patient and family. After consideration of risks, benefits and other options for treatment, the patient has consented to  Procedure(s): Right Heart Cath (N/A) as a surgical intervention .  The patient's history has been reviewed, patient examined, no change in status, stable for surgery.  I have reviewed the patient's chart and labs.  Questions were answered to the patient's satisfaction.     Taeshawn Helfman, Reuel Boom

## 2017-01-15 NOTE — Progress Notes (Signed)
PT Cancellation Note  Patient Details Name: Donald Dunlap MRN: [id number] DOB: Aug 02, 1951   Cancelled Treatment:    Reason Eval/Treat Not Completed: Patient at procedure or test/unavailable. Pt undergoing heart cath. PT to re-attempt as time allows.   Ilda Foil 01/15/2017, 8:25 AM

## 2017-01-16 LAB — GLUCOSE, CAPILLARY
GLUCOSE-CAPILLARY: 177 mg/dL — AB (ref 65–99)
GLUCOSE-CAPILLARY: 131 mg/dL — AB (ref 65–99)
Glucose-Capillary: 201 mg/dL — ABNORMAL HIGH (ref 65–99)
Glucose-Capillary: 80 mg/dL (ref 65–99)

## 2017-01-16 LAB — BASIC METABOLIC PANEL
Anion gap: 9 (ref 5–15)
BUN: 42 mg/dL — AB (ref 6–20)
CALCIUM: 9.3 mg/dL (ref 8.9–10.3)
CO2: 28 mmol/L (ref 22–32)
CREATININE: 1.7 mg/dL — AB (ref 0.61–1.24)
Chloride: 96 mmol/L — ABNORMAL LOW (ref 101–111)
GFR calc non Af Amer: 41 mL/min — ABNORMAL LOW (ref >60)
GFR, EST AFRICAN AMERICAN: 47 mL/min — AB (ref >60)
Glucose, Bld: 233 mg/dL — ABNORMAL HIGH (ref 65–99)
Potassium: 3.7 mmol/L (ref 3.5–5.1)
SODIUM: 133 mmol/L — AB (ref 135–145)

## 2017-01-16 LAB — PROTIME-INR
INR: 2.34
PROTHROMBIN TIME: 26.1 s — AB (ref 11.4–15.2)

## 2017-01-16 LAB — COOXEMETRY PANEL
Carboxyhemoglobin: 1.8 % — ABNORMAL HIGH (ref 0.5–1.5)
Methemoglobin: 1.3 % (ref 0.0–1.5)
O2 SAT: 73.4 %
TOTAL HEMOGLOBIN: 12.4 g/dL (ref 12.0–16.0)

## 2017-01-16 NOTE — Progress Notes (Signed)
ANTICOAGULATION CONSULT NOTE - Follow Up Consult  Pharmacy Consult for Coumadin Indication: atrial fibrillation  Allergies  Allergen Reactions  . Latex     Rash   . Oxycodone     hallucinations    Patient Measurements: Height:  (175.3 cm) Weight: 260 lb 5.8 oz (118.1 kg) IBW/kg (Calculated) : 70.7  Vital Signs: Temp: 98.2 F (36.8 C) (04/28 1138) Temp Source: Oral (04/28 1138) BP: 109/56 (04/28 1138) Pulse Rate: 93 (04/28 1138)  Labs:  Recent Labs  01/14/17 0235 01/15/17 0422 01/16/17 0414  LABPROT 26.2* 25.3* 26.1*  INR 2.35 2.26 2.34  CREATININE 1.77* 1.74* 1.70*    Estimated Creatinine Clearance: 55 mL/min (A) (by C-G formula based on SCr of 1.7 mg/dL (H)).  Assessment: 65yom continues on coumadin for afib. INR previously slightly above goal with peak at 3.18, then down to 2.16 after 4 days of reduced doses, then bump up to 2.3 with home dose. Regimen adjusted 4/25 to give lower doses than PTA. INR remains at goal today at 2.34.  PTA dose:  daily except  Mon/Fri  Goal of Therapy:  INR 2-3  Monitor platelets by anticoagulation protocol: Yes   Plan:  1) Continue coumadin  MWF/2mg  TTSS 2) Daily INR + CBC  Lexee Brashears K. Bonnye Fava, PharmD, BCPS, CPP Clinical Pharmacist Pager: [phone] Phone: [phone] 01/16/2017 12:33 PM

## 2017-01-16 NOTE — Progress Notes (Signed)
Subjective:  Tired right neck hurts from attempted IJ for right heart cath   Objective:  Vitals:   01/15/17 2324 01/16/17 0350 01/16/17 0401 01/16/17 0725  BP:   116/87 120/74  Pulse:   93 99  Resp:   17 18  Temp:   98.3 F (36.8 C) 98.1 F (36.7 C)  TempSrc:   Oral Oral  SpO2: 93%  97% 98%  Weight:  260 lb 5.8 oz (118.1 kg)    Height:        Intake/Output from previous day:  Intake/Output Summary (Last 24 hours) at 01/16/17 0741 Last data filed at 01/16/17 0600  Gross per 24 hour  Intake           869.44 ml  Output             1750 ml  Net          -880.56 ml    Physical Exam: Affect appropriate Chronically ill white male  HEENT: normal Neck supple with no adenopathy JVP elevated no bruits no thyromegaly Lungs clear with no wheezing and good diaphragmatic motion Heart:  S1/S2 TR murmur, no rub, gallop or click PMI enlarged  Abdomen: benighn, BS positve, no tenderness, no AAA no bruit.  No HSM or HJR Distal pulses intact with no bruits Plus 2 chronic edema bilateral with dressings in place  Neuro non-focal Skin warm and dry No muscular weakness   Lab Results: Basic Metabolic Panel:  Recent Labs  84/69/62 0235 01/15/17 0422 01/16/17 0414  NA 131* 132* 133*  K 4.1 4.5 3.7  CL 94* 95* 96*  CO2 GLUCOSE 158* 160* 233*  BUN 35* 39* 42*  CREATININE 1.77* 1.74* 1.70*  CALCIUM 10.1 9.9 9.3  MG 2.0  --   --     Imaging: Dg Chest Port 1 View  Result Date: 01/15/2017 CLINICAL DATA:  Status post line placement. EXAM: PORTABLE CHEST 1 VIEW COMPARISON:  Single-view of the chest 01/07/2017. FINDINGS: A new PICC is in place with the tip projecting in the right atrium. The line could be withdrawn 3.5 cm for positioning near the superior cavoatrial junction. AICD is unchanged. There is marked cardiomegaly. Pulmonary edema seen on the comparison study has improved. No pneumothorax or pleural fluid. IMPRESSION: Tip of right PICC projects in the right  atrium. The line could be withdrawn approximately 3.5 cm for positioning near the superior cavoatrial junction. Marked cardiomegaly. Pulmonary edema seen on the prior study has improved with mild interstitial edema persisting. Electronically Signed   By: Drusilla Kanner M.D.   On: 01/15/2017 15:54    Cardiac Studies:  ECG: afib LBBB PVC   Telemetry:  afib with PVCls   Echo: EF 30-35% moderate MR   Medications:   . acetaZOLAMIDE  500 mg Oral Q12H  . allopurinol  100 mg Oral Daily  . aspirin EC  81 mg Oral Daily  . atorvastatin  20 mg Oral q1800  . calcitRIOL  0.25 mcg Oral Daily  . carvedilol  3.125 mg Oral BID WC  . furosemide  80 mg Intravenous BID  . glipiZIDE  5 mg Oral BID AC  . insulin aspart  0-20 Units Subcutaneous TID WC  . insulin aspart  15 Units Subcutaneous TID WC  . insulin glargine  25 Units Subcutaneous Daily  . pantoprazole  40 mg Oral BID WC  . sodium chloride flush  10-40 mL Intracatheter Q12H  . sodium chloride flush  3  mL Intravenous Q12H  . spironolactone  25 mg Oral Daily  . torsemide  40 mg Oral BID  . warfarin  2 mg Oral Q T,Th,S,Su-1800  . warfarin  4 mg Oral Q M,W,F-1800  . Warfarin - Pharmacist Dosing Inpatient   Does not apply q1800     . sodium chloride    . milrinone 0.25 mcg/kg/min (01/16/17 0031)    Assessment/Plan:   Biventricular Failure:  Right heart cath yesterday with severe elevation in pressures RA 22 PCWP 32 PA 88/39 Started on milrinone continue diuresis with lasix and acetozolamide   Afib:  On coumadin INR Rx   DM:  SS insulin and oral agents   Chol:  On statin  Charlton Haws 01/16/2017, 7:41 AM

## 2017-01-17 LAB — COOXEMETRY PANEL
CARBOXYHEMOGLOBIN: 1.7 % — AB (ref 0.5–1.5)
METHEMOGLOBIN: 1.1 % (ref 0.0–1.5)
O2 Saturation: 81.2 %
Total hemoglobin: 12.7 g/dL (ref 12.0–16.0)

## 2017-01-17 LAB — BLOOD GAS, ARTERIAL
ACID-BASE EXCESS: 5.5 mmol/L — AB (ref 0.0–2.0)
Bicarbonate: 30.5 mmol/L — ABNORMAL HIGH (ref 20.0–28.0)
DRAWN BY: 28338
O2 Content: 4 L/min
O2 SAT: 98.2 %
PATIENT TEMPERATURE: 98.2
pCO2 arterial: 52.7 mmHg — ABNORMAL HIGH (ref 32.0–48.0)
pH, Arterial: 7.379 (ref 7.350–7.450)
pO2, Arterial: 117 mmHg — ABNORMAL HIGH (ref 83.0–108.0)

## 2017-01-17 LAB — BASIC METABOLIC PANEL
Anion gap: 11 (ref 5–15)
BUN: 39 mg/dL — AB (ref 6–20)
CO2: 29 mmol/L (ref 22–32)
Calcium: 9.7 mg/dL (ref 8.9–10.3)
Chloride: 94 mmol/L — ABNORMAL LOW (ref 101–111)
Creatinine, Ser: 1.57 mg/dL — ABNORMAL HIGH (ref 0.61–1.24)
GFR calc Af Amer: 52 mL/min — ABNORMAL LOW (ref >60)
GFR, EST NON AFRICAN AMERICAN: 45 mL/min — AB (ref >60)
GLUCOSE: 127 mg/dL — AB (ref 65–99)
POTASSIUM: 3.3 mmol/L — AB (ref 3.5–5.1)
Sodium: 134 mmol/L — ABNORMAL LOW (ref 135–145)

## 2017-01-17 LAB — GLUCOSE, CAPILLARY
GLUCOSE-CAPILLARY: 134 mg/dL — AB (ref 65–99)
GLUCOSE-CAPILLARY: 140 mg/dL — AB (ref 65–99)
GLUCOSE-CAPILLARY: 191 mg/dL — AB (ref 65–99)
Glucose-Capillary: 122 mg/dL — ABNORMAL HIGH (ref 65–99)

## 2017-01-17 LAB — PROTIME-INR
INR: 2.73
Prothrombin Time: 29.5 seconds — ABNORMAL HIGH (ref 11.4–15.2)

## 2017-01-17 LAB — CBC
HCT: 39.8 % (ref 39.0–52.0)
Hemoglobin: 12.4 g/dL — ABNORMAL LOW (ref 13.0–17.0)
MCH: 26.5 pg (ref 26.0–34.0)
MCHC: 31.2 g/dL (ref 30.0–36.0)
MCV: 85 fL (ref 78.0–100.0)
PLATELETS: 137 10*3/uL — AB (ref 150–400)
RBC: 4.68 MIL/uL (ref 4.22–5.81)
RDW: 21.3 % — AB (ref 11.5–15.5)
WBC: 8.1 10*3/uL (ref 4.0–10.5)

## 2017-01-17 MED ORDER — POTASSIUM CHLORIDE CRYS ER 20 MEQ PO TBCR
20.0000 meq | EXTENDED_RELEASE_TABLET | Freq: Once | ORAL | Status: AC
  Administered 2017-01-17: 20 meq via ORAL
  Filled 2017-01-17: qty 1

## 2017-01-17 NOTE — Progress Notes (Signed)
Informed Donald Dunlap Course PA about ABG results. Informed PA that patient is more drowsy this afternoon and that family is concerned. PA states that he will speak with Dr. Eden Emms about this matter.

## 2017-01-17 NOTE — Progress Notes (Signed)
Subjective:  Tired right neck hurts from attempted IJ for right heart cath   Objective:  Vitals:   01/16/17 2023 01/16/17 2328 01/17/17 0357 01/17/17 0716  BP: 101/75 93/66 125/80 117/79  Pulse: 97 88 95 (!) 108  Resp: 15 13 (!) 22 (!) 25  Temp: 98.4 F (36.9 C) 98.3 F (36.8 C) 98.4 F (36.9 C) 97.5 F (36.4 C)  TempSrc:    Axillary  SpO2: 96% 96% 96% 99%  Weight:   251 lb 4.8 oz (114 kg)   Height:        Intake/Output from previous day:  Intake/Output Summary (Last 24 hours) at 01/17/17 0755 Last data filed at 01/17/17 0716  Gross per 24 hour  Intake           821.92 ml  Output             2525 ml  Net         -1703.08 ml    Physical Exam: Affect appropriate Chronically ill white male  HEENT: normal Neck supple with no adenopathy JVP elevated no bruits no thyromegaly Lungs clear with no wheezing and good diaphragmatic motion Heart:  S1/S2 TR murmur, no rub, gallop or click PMI enlarged  Abdomen: benighn, BS positve, no tenderness, no AAA no bruit.  No HSM or HJR Distal pulses intact with no bruits Plus 2 chronic edema bilateral with dressings in place  Neuro non-focal Skin warm and dry No muscular weakness   Lab Results: Basic Metabolic Panel:  Recent Labs  16/10/96 0414 01/17/17 0525  NA 133* 134*  K 3.7 3.3*  CL 96* 94*  CO2 28 29  GLUCOSE 233* 127*  BUN 42* 39*  CREATININE 1.70* 1.57*  CALCIUM 9.3 9.7    Imaging: Dg Chest Port 1 View  Result Date: 01/15/2017 CLINICAL DATA:  Status post line placement. EXAM: PORTABLE CHEST 1 VIEW COMPARISON:  Single-view of the chest 01/07/2017. FINDINGS: A new PICC is in place with the tip projecting in the right atrium. The line could be withdrawn 3.5 cm for positioning near the superior cavoatrial junction. AICD is unchanged. There is marked cardiomegaly. Pulmonary edema seen on the comparison study has improved. No pneumothorax or pleural fluid. IMPRESSION: Tip of right PICC projects in the right  atrium. The line could be withdrawn approximately 3.5 cm for positioning near the superior cavoatrial junction. Marked cardiomegaly. Pulmonary edema seen on the prior study has improved with mild interstitial edema persisting. Electronically Signed   By: Drusilla Kanner M.D.   On: 01/15/2017 15:54    Cardiac Studies:  ECG: afib LBBB PVC   Telemetry:  afib with PVCls   Echo: EF 30-35% moderate MR   Medications:   . acetaZOLAMIDE  500 mg Oral Q12H  . allopurinol  100 mg Oral Daily  . aspirin EC  81 mg Oral Daily  . atorvastatin  20 mg Oral q1800  . calcitRIOL  0.25 mcg Oral Daily  . carvedilol  3.125 mg Oral BID WC  . furosemide  80 mg Intravenous BID  . glipiZIDE  5 mg Oral BID AC  . insulin aspart  0-20 Units Subcutaneous TID WC  . insulin aspart  15 Units Subcutaneous TID WC  . insulin glargine  25 Units Subcutaneous Daily  . pantoprazole  40 mg Oral BID WC  . sodium chloride flush  10-40 mL Intracatheter Q12H  . sodium chloride flush  3 mL Intravenous Q12H  . spironolactone  25 mg Oral Daily  .  warfarin  2 mg Oral Q T,Th,S,Su-1800  . warfarin  4 mg Oral Q M,W,F-1800  . Warfarin - Pharmacist Dosing Inpatient   Does not apply q1800     . sodium chloride    . milrinone 0.25 mcg/kg/min (01/17/17 0524)    Assessment/Plan:   Biventricular Failure:  Right heart cath Friday  with severe elevation in pressures RA 22 PCWP 32 PA 88/39 Started on milrinone continue diuresis with lasix and acetozolamide coox over 80% this am consider Adding revatio if BP tolerates when off milrinone   Afib:  On coumadin INR Rx 2.73   DM:  SS insulin and oral agents   Chol:  On statin  Charlton Haws 01/17/2017, 7:55 AM

## 2017-01-17 NOTE — Progress Notes (Signed)
Spoke with Dr. Eden Emms about patient being  drowsy this morning and falling asleep while talking. Informed MD he can answer all orientation questions. Per MD patient was drowsy yesterday as well. MD stated if patient is still drowsy this afternoon to place an order for an ABG.

## 2017-01-17 NOTE — Progress Notes (Signed)
ANTICOAGULATION CONSULT NOTE - Follow Up Consult  Pharmacy Consult for Coumadin Indication: atrial fibrillation  Allergies  Allergen Reactions  . Latex     Rash   . Oxycodone     hallucinations    Patient Measurements: Height:  (175.3 cm) Weight: 251 lb 4.8 oz (114 kg) IBW/kg (Calculated) : 70.7  Vital Signs: Temp: 98.2 F (36.8 C) (04/29 1124) Temp Source: Oral (04/29 1124) BP: 105/71 (04/29 1124) Pulse Rate: 92 (04/29 1124)  Labs:  Recent Labs  01/15/17 0422 01/16/17 0414 01/17/17 0525  HGB  --   --  12.4*  HCT  --   --  39.8  PLT  --   --  137*  LABPROT 25.3* 26.1* 29.5*  INR 2.26 2.34 2.73  CREATININE 1.74* 1.70* 1.57*    Estimated Creatinine Clearance: 58.4 mL/min (A) (by C-G formula based on SCr of 1.57 mg/dL (H)).  Assessment: 65yom continues on coumadin for afib. INR previously slightly above goal with peak at 3.18, then down to 2.16 after 4 days of reduced doses, then bump up to 2.3 with home dose. Regimen adjusted 4/25 to give lower doses than PTA.   INR remains at goal today but increased significantly to 2.73. May need to lower dose again. CBC remains low but stable. No reported s/s bleeding.   PTA dose:  daily except  Mon/Fri  Goal of Therapy:  INR 2-3  Monitor platelets by anticoagulation protocol: Yes   Plan:  1) Continue coumadin  MWF/2mg  TTSS 2) Daily INR + CBC  Cristina Mattern K. Bonnye Fava, PharmD, BCPS, CPP Clinical Pharmacist Pager: [phone] Phone: [phone] 01/17/2017 1:18 PM

## 2017-01-18 LAB — CBC
HEMATOCRIT: 40.4 % (ref 39.0–52.0)
Hemoglobin: 12.2 g/dL — ABNORMAL LOW (ref 13.0–17.0)
MCH: 26.1 pg (ref 26.0–34.0)
MCHC: 30.2 g/dL (ref 30.0–36.0)
MCV: 86.3 fL (ref 78.0–100.0)
PLATELETS: 169 10*3/uL (ref 150–400)
RBC: 4.68 MIL/uL (ref 4.22–5.81)
RDW: 22 % — ABNORMAL HIGH (ref 11.5–15.5)
WBC: 10.3 10*3/uL (ref 4.0–10.5)

## 2017-01-18 LAB — BASIC METABOLIC PANEL
Anion gap: 11 (ref 5–15)
BUN: 40 mg/dL — AB (ref 6–20)
CALCIUM: 9.7 mg/dL (ref 8.9–10.3)
CHLORIDE: 95 mmol/L — AB (ref 101–111)
CO2: 29 mmol/L (ref 22–32)
CREATININE: 1.6 mg/dL — AB (ref 0.61–1.24)
GFR calc non Af Amer: 44 mL/min — ABNORMAL LOW (ref >60)
GFR, EST AFRICAN AMERICAN: 51 mL/min — AB (ref >60)
Glucose, Bld: 242 mg/dL — ABNORMAL HIGH (ref 65–99)
Potassium: 3.4 mmol/L — ABNORMAL LOW (ref 3.5–5.1)
SODIUM: 135 mmol/L (ref 135–145)

## 2017-01-18 LAB — COOXEMETRY PANEL
Carboxyhemoglobin: 1.6 % — ABNORMAL HIGH (ref 0.5–1.5)
Methemoglobin: 1.2 % (ref 0.0–1.5)
O2 SAT: 75.7 %
TOTAL HEMOGLOBIN: 12.4 g/dL (ref 12.0–16.0)

## 2017-01-18 LAB — GLUCOSE, CAPILLARY
GLUCOSE-CAPILLARY: 127 mg/dL — AB (ref 65–99)
Glucose-Capillary: 188 mg/dL — ABNORMAL HIGH (ref 65–99)
Glucose-Capillary: 150 mg/dL — ABNORMAL HIGH (ref 65–99)
Glucose-Capillary: 179 mg/dL — ABNORMAL HIGH (ref 65–99)

## 2017-01-18 LAB — PROTIME-INR
INR: 2.62
PROTHROMBIN TIME: 28.5 s — AB (ref 11.4–15.2)

## 2017-01-18 MED ORDER — POTASSIUM CHLORIDE CRYS ER 20 MEQ PO TBCR
40.0000 meq | EXTENDED_RELEASE_TABLET | Freq: Two times a day (BID) | ORAL | Status: AC
  Administered 2017-01-18 (×2): 40 meq via ORAL
  Filled 2017-01-18 (×2): qty 2

## 2017-01-18 MED ORDER — METOLAZONE 5 MG PO TABS
2.5000 mg | ORAL_TABLET | Freq: Once | ORAL | Status: AC
  Administered 2017-01-18: 2.5 mg via ORAL
  Filled 2017-01-18: qty 1

## 2017-01-18 NOTE — Progress Notes (Signed)
PT Cancellation Note  Patient Details Name: Alakai Macbride MRN: [id number] DOB: 08-14-1951   Cancelled Treatment:    Reason Eval/Treat Not Completed: Patient declined, reports he just had dressing changed and doesn't feel he can get up at this time. Will continue attempts.   [name] 01/18/2017, 2:40 PM  [employer] PT [phone]

## 2017-01-18 NOTE — Progress Notes (Signed)
ANTICOAGULATION CONSULT NOTE - Follow Up Consult  Pharmacy Consult for Coumadin Indication: atrial fibrillation  Patient Measurements: Height:  (175.3 cm) Weight: 251 lb 11.2 oz (114.2 kg) IBW/kg (Calculated) : 70.7  Vital Signs: Temp: 97.7 F (36.5 C) (04/30 0732) Temp Source: Oral (04/30 0732) BP: 100/75 (04/30 0732) Pulse Rate: 86 (04/30 0732)  Labs:  Recent Labs  01/16/17 0414 01/17/17 0525 01/18/17 0500  HGB  --  12.4* 12.2*  HCT  --  39.8 40.4  PLT  --  137* 169  LABPROT 26.1* 29.5* 28.5*  INR 2.34 2.73 2.62  CREATININE 1.70* 1.57* 1.60*    Estimated Creatinine Clearance: 57.4 mL/min (A) (by C-G formula based on SCr of 1.6 mg/dL (H)).  Assessment: 65yom continues on coumadin for afib. INR previously slightly above goal with peak at 3.18, then down to 2.16 after 4 days of reduced doses, then bump up to 2.3 with home dose. Regimen adjusted 4/25 to give lower doses than PTA.   INR remains stable and at goal today at 2.6. CBC remains stable. No reported s/s bleeding.   PTA dose:  daily except  Mon/Fri  Goal of Therapy:  INR 2-3  Monitor platelets by anticoagulation protocol: Yes   Plan:  1) Continue home dose today with coumadin  MWF/2mg  TTSS 2) Daily INR + CBC  Sheppard Coil PharmD., BCPS Clinical Pharmacist Pager [phone] 01/18/2017 9:11 AM

## 2017-01-18 NOTE — Progress Notes (Signed)
Advanced Heart Failure Rounding Note   Subjective:    Admitted 01/07/17 from CHF clinic with volume overload. Started on IV Lasix + milrinone 0.60mcg.   Weight down 14 pounds from admission.  Creatinine stable. Lethargic, answers questions appropriately. Denies SOB at rest. CVP 16 this am. Co ox 76.   RHC 01/15/17 Findings:  RA = 22 RV = 81/19 PA = 88/39 (56) PCW = 32 Fick cardiac output/index = 4.3/1.9 Thermo CO/CI = 4.3/1.9 PVR = 5.6 WU Ao sat = 92% PA sat = 49%, 50%  Objective:   Weight Range:  Vital Signs:   Temp:  [97.7 F (36.5 C)-98.2 F (36.8 C)] 97.7 F (36.5 C) (04/30 0732) Pulse Rate:  [61-95] 86 (04/30 0732) Resp:  [16-27] 19 (04/30 0732) BP: (76-120)/(53-84) 100/75 (04/30 0732) SpO2:  [93 %-99 %] 95 % (04/30 0732) Weight:  [251 lb 11.2 oz (114.2 kg)] 251 lb 11.2 oz (114.2 kg) (04/30 0503) Last BM Date: 01/14/17  Weight change: Filed Weights   01/16/17 0350 01/17/17 0357 01/18/17 0503  Weight: 260 lb 5.8 oz (118.1 kg) 251 lb 4.8 oz (114 kg) 251 lb 11.2 oz (114.2 kg)    Intake/Output:   Intake/Output Summary (Last 24 hours) at 01/18/17 0853 Last data filed at 01/18/17 0800  Gross per 24 hour  Intake           1009.6 ml  Output             1275 ml  Net           -265.4 ml     Physical Exam: General: lethargic male, NAD. Lying in bed. Falls asleep quickly.   HEENT: Normal, poor dentition.  Neck: supple. JVP to jaw. Carotids 2+ bilat; no bruits. No lymphadenopathy or thryomegaly appreciated. Cor: PMI nondisplaced.Irregular rate and rhythm. No murmurs, rubs or gallops.  Lungs: Normal effort, diminished crackles in bases. Clear to diminished in bilateral upper lobes.  Abdomen: soft, nontender, obese. No hepatosplenomegaly. No bruits or masses. Good bowel sounds. Extremities: no cyanosis, clubbing, rash,warm. Compression wraps in place, but marked pitting edema in bilateral lower extremities.  Neuro: alert & orientedx3, cranial nerves grossly  intact. moves all 4 extremities w/o difficulty. Affect pleasant   Telemetry: Afib, frequent PVC's rates in the 80's - personally reviewed  Labs: Basic Metabolic Panel:  Recent Labs Lab 01/12/17 0149  01/14/17 0235 01/15/17 0422 01/16/17 0414 01/17/17 0525 01/18/17 0500  NA 136  < > 131* 132* 133* 134* 135  K 4.5  < > 4.1 4.5 3.7 3.3* 3.4*  CL 96*  < > 94* 95* 96* 94* 95*  CO2 31  < > GLUCOSE 151*  < > 158* 160* 233* 127* 242*  BUN 41*  < > 35* 39* 42* 39* 40*  CREATININE 1.51*  < > 1.77* 1.74* 1.70* 1.57* 1.60*  CALCIUM 10.0  < > 10.1 9.9 9.3 9.7 9.7  MG 1.9  --  2.0  --   --   --   --   < > = values in this interval not displayed.    CBC:  Recent Labs Lab 01/13/17 0306 01/17/17 0525 01/18/17 0500  WBC 6.4 8.1 10.3  NEUTROABS 4.1  --   --   HGB 12.1* 12.4* 12.2*  HCT 40.5 39.8 40.4  MCV 85.1 85.0 86.3  PLT 156 137* 169   BNP: BNP (last 3 results)  Recent Labs  01/07/17 1421  BNP 695.6*  Right Heart Cath 01/15/17 Findings:  RA = 22 RV = 81/19 PA = 88/39 (56) PCW = 32 Fick cardiac output/index = 4.3/1.9 Thermo CO/CI = 4.3/1.9 PVR = 5.6 WU Ao sat = 92% PA sat = 49%, 50%  Assessment: 1. Markedly elevated biventricular pressures with severe mixed pulmonary HTN 2. Moderately reduced CO  Plan/Discussion:  Will move to SDU. Place PICC for further guidance. Restart milrinone. Resume IV diuresis. Prognosis guarded.      Medications:     Scheduled Medications: . acetaZOLAMIDE  500 mg Oral Q12H  . allopurinol  100 mg Oral Daily  . aspirin EC  81 mg Oral Daily  . atorvastatin  20 mg Oral q1800  . calcitRIOL  0.25 mcg Oral Daily  . carvedilol  3.125 mg Oral BID WC  . furosemide  80 mg Intravenous BID  . glipiZIDE  5 mg Oral BID AC  . insulin aspart  0-20 Units Subcutaneous TID WC  . insulin aspart  15 Units Subcutaneous TID WC  . insulin glargine  25 Units Subcutaneous Daily  . pantoprazole  40 mg Oral BID WC  . sodium  chloride flush  10-40 mL Intracatheter Q12H  . sodium chloride flush  3 mL Intravenous Q12H  . spironolactone  25 mg Oral Daily  . warfarin  2 mg Oral Q T,Th,S,Su-1800  . warfarin  4 mg Oral Q M,W,F-1800  . Warfarin - Pharmacist Dosing Inpatient   Does not apply q1800    Infusions: . sodium chloride    . milrinone 0.25 mcg/kg/min (01/18/17 0800)    PRN Medications: sodium chloride, acetaminophen, ondansetron (ZOFRAN) IV, sodium chloride flush, sodium chloride flush   Assessment/Plan/Discussion   1. 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  - Echo with EF 25-30%. Moderate RV dysfunction  - Weight down 14 pounds from admission, but still remains volume overloaded on exam - CVP 16.  - Remains NYHA IIIB - Continue milrinone gtt at 0.25 mcg  - Continue IV lasix at  BID.  - Add 2.5 mg metolazone today.  - Continue Coreg 3.125 mg BID.  - D/C diamox. CO2 stable.  2. CAD  -No angina.  Recent myoview 2017 without ischemia.  - Continue ASA and atorvastatin.  3. Chronic A fib - Rate controlled.   - This patients CHA2DS2-VASc Score and unadjusted Ischemic Stroke Rate (% per year) is equal to 7.2 % stroke rate/year from a score of 5 Above score calculated as 1 point each if present [CHF, HTN, DM, Vascular=MI/PAD/Aortic Plaque, Age if 65-74, or Male], 2 points each if present [Age > 75, or Stroke/TIA/TE] - Continue coumadin, INR 2.62 4. Acute on CKD Stage III - Creatinine stable at 1.6.  - baseline creatinine 1.2-1.4 5. Chronic Respiratory Failure  - On Home 02.  - Likely a component of OSA contributing to his hypersomnolence.  - He says he will not wear a CPAP, ABG done yesterday showed CO2 of 52.7, pH was normal.  6. R and LLE partial thickness wounds - WOC consulted with recommendations. Continue conservative wound care + mild compression. Wounds are improving. No evidence of cellulitis  - no change to current plan.  7. Hypokalemia:  - K 3.4.  - will restart KCl with the  addition of metolazone today.  8. NSVT - Frequent PVC's and short runs of NSVT - Replace K today.  - Mg has been stable. Will check in the am.    Little Ishikawa, NP  8:53 AM  Advanced Heart  Failure Team  Pager [phone] M-F 7am-4pm.  Please contact [facility] for night-coverage after hours (4p -7a ) and weekends on [email or web address]   Length of Stay: 11   Patient seen with NP, agree with the above note.  Diuresis poor yesterday, BUN/creatinine fairly stable.  CVP remains 16 with volume overload on exam.  Today, will give Lasix 80 mg IV bid with 1 dose of metolazone.  Replace K.   Marca Ancona 01/18/2017

## 2017-01-18 NOTE — Care Management Important Message (Signed)
Important Message  Patient Details  Name: Donald Dunlap MRN: [id number] Date of Birth: November 14, 1950   Medicare Important Message Given:  Yes    [name] 01/18/2017, 11:28 PM

## 2017-01-19 LAB — GLUCOSE, CAPILLARY
GLUCOSE-CAPILLARY: 144 mg/dL — AB (ref 65–99)
GLUCOSE-CAPILLARY: 209 mg/dL — AB (ref 65–99)
GLUCOSE-CAPILLARY: 136 mg/dL — AB (ref 65–99)
Glucose-Capillary: 138 mg/dL — ABNORMAL HIGH (ref 65–99)

## 2017-01-19 LAB — PROTIME-INR
INR: 2.47
PROTHROMBIN TIME: 27.2 s — AB (ref 11.4–15.2)

## 2017-01-19 LAB — BASIC METABOLIC PANEL
Anion gap: 8 (ref 5–15)
BUN: 39 mg/dL — ABNORMAL HIGH (ref 6–20)
CALCIUM: 9.9 mg/dL (ref 8.9–10.3)
CHLORIDE: 95 mmol/L — AB (ref 101–111)
CO2: 31 mmol/L (ref 22–32)
CREATININE: 1.53 mg/dL — AB (ref 0.61–1.24)
GFR, EST AFRICAN AMERICAN: 53 mL/min — AB (ref >60)
GFR, EST NON AFRICAN AMERICAN: 46 mL/min — AB (ref >60)
Glucose, Bld: 164 mg/dL — ABNORMAL HIGH (ref 65–99)
Potassium: 3.3 mmol/L — ABNORMAL LOW (ref 3.5–5.1)
SODIUM: 134 mmol/L — AB (ref 135–145)

## 2017-01-19 LAB — CBC
HCT: 40.8 % (ref 39.0–52.0)
Hemoglobin: 12.7 g/dL — ABNORMAL LOW (ref 13.0–17.0)
MCH: 26.4 pg (ref 26.0–34.0)
MCHC: 31.1 g/dL (ref 30.0–36.0)
MCV: 84.8 fL (ref 78.0–100.0)
PLATELETS: 172 10*3/uL (ref 150–400)
RBC: 4.81 MIL/uL (ref 4.22–5.81)
RDW: 21.8 % — AB (ref 11.5–15.5)
WBC: 11.1 10*3/uL — ABNORMAL HIGH (ref 4.0–10.5)

## 2017-01-19 LAB — COOXEMETRY PANEL
Carboxyhemoglobin: 1.6 % — ABNORMAL HIGH (ref 0.5–1.5)
Methemoglobin: 1 % (ref 0.0–1.5)
O2 Saturation: 65.9 %
TOTAL HEMOGLOBIN: 13.1 g/dL (ref 12.0–16.0)

## 2017-01-19 LAB — MAGNESIUM: Magnesium: 1.7 mg/dL (ref 1.7–2.4)

## 2017-01-19 MED ORDER — POTASSIUM CHLORIDE CRYS ER 20 MEQ PO TBCR
40.0000 meq | EXTENDED_RELEASE_TABLET | Freq: Two times a day (BID) | ORAL | Status: DC
  Administered 2017-01-19 – 2017-01-21 (×5): 40 meq via ORAL
  Filled 2017-01-19 (×5): qty 2

## 2017-01-19 MED ORDER — SORBITOL 70 % SOLN
30.0000 mL | Freq: Every day | Status: DC | PRN
  Administered 2017-01-19 – 2017-01-20 (×2): 30 mL via ORAL
  Filled 2017-01-19 (×2): qty 30

## 2017-01-19 MED ORDER — FUROSEMIDE 10 MG/ML IJ SOLN
120.0000 mg | Freq: Two times a day (BID) | INTRAMUSCULAR | Status: DC
  Administered 2017-01-19 – 2017-01-20 (×3): 120 mg via INTRAVENOUS
  Filled 2017-01-19 (×3): qty 12
  Filled 2017-01-19 (×2): qty 10

## 2017-01-19 MED ORDER — MAGNESIUM SULFATE 4 GM/100ML IV SOLN
4.0000 g | Freq: Once | INTRAVENOUS | Status: AC
  Administered 2017-01-19: 4 g via INTRAVENOUS
  Filled 2017-01-19: qty 100

## 2017-01-19 MED ORDER — METOLAZONE 5 MG PO TABS
2.5000 mg | ORAL_TABLET | Freq: Once | ORAL | Status: AC
  Administered 2017-01-19: 2.5 mg via ORAL
  Filled 2017-01-19: qty 1

## 2017-01-19 NOTE — Care Management Note (Addendum)
Case Management Note  Patient Details  Name: Donald Dunlap MRN: [id number] Date of Birth: 24-Mar-1951  Subjective/Objective: Pt presented with CHF-volume overload. Continues on IV Milrinone and IV Lasix bid. Pt is from home with wife and plans to return home. Pt has DME Cane, 02 at [facility]2.[facility] L [facility]  Pt wants a Rollator for d/c.-Pt will need an order for DME Rollator once stable. DME-Referral made to [employer].   Action/Plan: CM will continue to monitor to see if pt will need a RN for [facility] Needs as it gets closer to d/c.   Expected Discharge Date:                  Expected Discharge Plan:  Home w Home Health Services  In-House Referral:  NA  Discharge planning Services  CM Consult  Post Acute Care Choice:   Durable Medical Equipment Choice offered to:   Patient  DME Arranged:   Rollator, Oxygen DME Agency:  [facility]  HH Arranged:   Registered Nurse [facility] Agency:    [facility] now [facility]    Status of Service:  In process, will continue to follow  If discussed at Long Length of Stay Meetings, dates discussed:  01-21-17  Additional Comments: 1439 01-21-17 Tomi Bamberger, RN,BSN [phone] CM did speak with pt in regards to disposition needs. Pt wants a HHRN- orders in system. Agency list provided. Pt chose [facility]- Referral faxed and SOC to begin within 24-48 hours post d/c. Pt has used the 30 day free Entresto card in the past- CM did call Daphne from [facility] and samples provided. CM did provide pt with patient assistance application- per pt he will not be able to afford.  - Heart Failure Clinic to f/u @ next appointment. DME Rollator to be delivered to patient's home due to agency had to order. No further needs from CM at this time.    1115 01-21-17 Tomi Bamberger, RN,BSN [phone] CM will provide pt with 30 day free card. CM to call MD with Prior Authorization.   S/W [name] @ [employer] RX # [id number]   sacubitril-valartan ( ENTRESTO ) 24-26 BID   COVER- YES  CO-PAY- $ 209.03  TIER- 3 DRUG  PRIOR APPROVAL- YES # [phone]   PHARMACY : [facility]   1021 01-21-17 Tomi Bamberger, RN,BSN [phone] [facility] to deliver DME to room before d/c. CM will speak with pt in regards to [facility] Services. Pt was discussed in the [facility] Meeting.    1143 01-20-17 Tomi Bamberger, RN,BSN [phone] CM received a call from Daughter Crystal in regards to pt needs his 02 tanks refilled. CM did call [facility] now [employer]-pt will need new ambulatory sat / DME 02 order and liter flow in order to qualify for 02. Pt will need to be billed.  Please fax sat qualifications/ new order to [phone].  Gala Lewandowsky, RN 01/19/2017, 11:29 AM

## 2017-01-19 NOTE — Progress Notes (Signed)
Advanced Heart Failure Rounding Note   Subjective:    Admitted 01/07/17 from CHF clinic with volume overload. Started on IV Lasix + milrinone 0.37mcg.   Weight down 16 pounds from admission, metolazone added yesterday.   Co ox stable at 66. Creatinine stable. CVP 10. More alert today, remains SOB. Denies chest pain and palpitations.   RHC 01/15/17 Findings:  RA = 22 RV = 81/19 PA = 88/39 (56) PCW = 32 Fick cardiac output/index = 4.3/1.9 Thermo CO/CI = 4.3/1.9 PVR = 5.6 WU Ao sat = 92% PA sat = 49%, 50%  Objective:   Weight Range:  Vital Signs:   Temp:  [97.6 F (36.4 C)-98.3 F (36.8 C)] 97.9 F (36.6 C) (05/01 0351) Pulse Rate:  [79-103] 103 (05/01 0351) Resp:  [16-19] 17 (05/01 0351) BP: (97-136)/(50-80) 97/50 (05/01 0351) SpO2:  [93 %-99 %] 96 % (05/01 0351) Weight:  [249 lb 12.8 oz (113.3 kg)] 249 lb 12.8 oz (113.3 kg) (05/01 0351) Last BM Date: 01/14/17  Weight change: Filed Weights   01/17/17 0357 01/18/17 0503 01/19/17 0351  Weight: 251 lb 4.8 oz (114 kg) 251 lb 11.2 oz (114.2 kg) 249 lb 12.8 oz (113.3 kg)    Intake/Output:   Intake/Output Summary (Last 24 hours) at 01/19/17 0704 Last data filed at 01/19/17 0600  Gross per 24 hour  Intake            925.4 ml  Output             2915 ml  Net          -1989.6 ml     Physical Exam: General: Older than stated age appearing male. NAD. Lying in bed.   HEENT: Normal, poor dentition.  Neck: supple. JVP 9-10 cm. Carotids 2+ bilat; no bruits. No lymphadenopathy or thryomegaly appreciated. Cor: PMI nondisplaced. Irregular rate and rhythm.  No murmurs, rubs or gallops.  Lungs:Normal effort. Fine crackles in bilateral bases.  Clear in upper lobes bilaterally.  Abdomen: Soft, nontender, obese. No hepatosplenomegaly. No bruits or masses. Good bowel sounds. Extremities: no cyanosis, clubbing, rash,warm. Compression wraps in place. 2+ edema to knees.  Neuro: alert & orientedx3, cranial nerves grossly intact.  moves all 4 extremities w/o difficulty.Affect pleasant.   Telemetry: Afib 90-105, frequent PVC's - Personally reviewed   Labs: Basic Metabolic Panel:  Recent Labs Lab 01/14/17 0235 01/15/17 0422 01/16/17 0414 01/17/17 0525 01/18/17 0500 01/19/17 0500  NA 131* 132* 133* 134* 135 134*  K 4.1 4.5 3.7 3.3* 3.4* 3.3*  CL 94* 95* 96* 94* 95* 95*  CO2 GLUCOSE 158* 160* 233* 127* 242* 164*  BUN 35* 39* 42* 39* 40* 39*  CREATININE 1.77* 1.74* 1.70* 1.57* 1.60* 1.53*  CALCIUM 10.1 9.9 9.3 9.7 9.7 9.9  MG 2.0  --   --   --   --  1.7      CBC:  Recent Labs Lab 01/13/17 0306 01/17/17 0525 01/18/17 0500 01/19/17 0500  WBC 6.4 8.1 10.3 11.1*  NEUTROABS 4.1  --   --   --   HGB 12.1* 12.4* 12.2* 12.7*  HCT 40.5 39.8 40.4 40.8  MCV 85.1 85.0 86.3 84.8  PLT 156 137* 169 172   BNP: BNP (last 3 results)  Recent Labs  01/07/17 1421  BNP 695.6*   Right Heart Cath 01/15/17 Findings:  RA = 22 RV = 81/19 PA = 88/39 (56) PCW = 32 Fick cardiac output/index = 4.3/1.9  Thermo CO/CI = 4.3/1.9 PVR = 5.6 WU Ao sat = 92% PA sat = 49%, 50%  Assessment: 1. Markedly elevated biventricular pressures with severe mixed pulmonary HTN 2. Moderately reduced CO   Medications:     Scheduled Medications: . allopurinol  100 mg Oral Daily  . aspirin EC  81 mg Oral Daily  . atorvastatin  20 mg Oral q1800  . calcitRIOL  0.25 mcg Oral Daily  . carvedilol  3.125 mg Oral BID WC  . furosemide  80 mg Intravenous BID  . glipiZIDE  5 mg Oral BID AC  . insulin aspart  0-20 Units Subcutaneous TID WC  . insulin aspart  15 Units Subcutaneous TID WC  . insulin glargine  25 Units Subcutaneous Daily  . pantoprazole  40 mg Oral BID WC  . sodium chloride flush  10-40 mL Intracatheter Q12H  . sodium chloride flush  3 mL Intravenous Q12H  . spironolactone  25 mg Oral Daily  . warfarin  2 mg Oral Q T,Th,S,Su-1800  . warfarin  4 mg Oral Q M,W,F-1800  . Warfarin - Pharmacist  Dosing Inpatient   Does not apply q1800    Infusions: . sodium chloride 250 mL (01/18/17 1600)  . milrinone 0.25 mcg/kg/min (01/19/17 0355)    PRN Medications: sodium chloride, acetaminophen, ondansetron (ZOFRAN) IV, sodium chloride flush, sodium chloride flush   Assessment/Plan/Discussion   1. A/C Systolic Heart Failure / [employer] ICD: ICM, CABG in 2006. NYHA III. Echo this admission with EF 30-35%.  - remains volume overloaded on exam, diuresis improved with metolazone yesterday. Weights at home usually 240-242 lbs.  - Continue milrinone gtt at 0.25 mcg  - Continue lasix  BID - Give metolazone 2.5mg   - Continue Coreg 3.125 mg BID.  2. CAD  - No angina.  Recent myoview 2017 without ischemia.  - Continue ASA and atorvastatin.  - No change to current plan.  3. Chronic A fib - Rate controlled.   - This patients CHA2DS2-VASc Score and unadjusted Ischemic Stroke Rate (% per year) is equal to 7.2 % stroke rate/year from a score of 5 Above score calculated as 1 point each if present [CHF, HTN, DM, Vascular=MI/PAD/Aortic Plaque, Age if 65-74, or Male], 2 points each if present [Age > 75, or Stroke/TIA/TE] - Continue coumadin for anticoagulation. INR 2.47 today 4. Acute on CKD Stage III - Creatinine stable at 1.53.  - baseline creatinine 1.2-1.4 - Follow with daily BMET.  5. Chronic Respiratory Failure  - On Home O2.  - Likely a component of OSA contributing to his hypersomnolence.  - He says he will not wear a CPAP, ABG done showed CO2 of 52.7, pH was normal.  - Improved today. No change to current plan.  6. R and LLE partial thickness wounds - WOC consulted with recommendations. Continue conservative wound care + mild compression. Wounds are improving. No evidence of cellulitis  - No change to current plan. 7. Hypokalemia:  - K 3.3 today.  - Continue KCl BID.  8. NSVT - Mg 1.7 - Will give 4gm today.    Little Ishikawa, NP  7:04 AM  Advanced Heart Failure Team    Pager [phone] M-F 7am-4pm.  Please contact [facility] for night-coverage after hours (4p -7a ) and weekends on [email or web address]  Length of Stay: 12   Patient seen and examined with Suzzette Righter, NP. We discussed all aspects of the encounter. I agree with the assessment and plan as stated above.   He  remains very tenuous despite inotropic support. Diuresing slowly. Will increase lasix to 120 IV bid and add metolazone.   Renal function stable today. K low. Will supp.   LE wounds improving. Continue wraps.   Remains in AF. Rate up slightly. Continue warfarin. INR 2.5  Arvilla Meres, MD  5:02 PM

## 2017-01-19 NOTE — Progress Notes (Signed)
ANTICOAGULATION CONSULT NOTE - Follow Up Consult  Pharmacy Consult for Coumadin Indication: atrial fibrillation  Patient Measurements: Height:  (175.3 cm) Weight: 249 lb 12.8 oz (113.3 kg) IBW/kg (Calculated) : 70.7  Vital Signs: Temp: 98.4 F (36.9 C) (05/01 0737) Temp Source: Oral (05/01 0737) BP: 110/60 (05/01 0954) Pulse Rate: 49 (05/01 0954)  Labs:  Recent Labs  01/17/17 0525 01/18/17 0500 01/19/17 0500  HGB 12.4* 12.2* 12.7*  HCT 39.8 40.4 40.8  PLT 137* 169 172  LABPROT 29.5* 28.5* 27.2*  INR 2.73 2.62 2.47  CREATININE 1.57* 1.60* 1.53*    Estimated Creatinine Clearance: 59.7 mL/min (A) (by C-G formula based on SCr of 1.53 mg/dL (H)).  Assessment: 65yom continues on coumadin for afib. INR previously slightly above goal with peak at 3.18, then down to 2.16 after 4 days of reduced doses, then bump up to 2.3 with home dose. Regimen adjusted 4/25 to give lower doses than PTA.   INR remains stable and at goal today at 2.4. CBC remains stable. No reported s/s bleeding.   PTA dose:  daily except  Mon/Fri  Goal of Therapy:  INR 2-3  Monitor platelets by anticoagulation protocol: Yes   Plan:  1) Continue home dose with coumadin  MWF/2mg  TTSS 2) Daily INR + CBC  Sheppard Coil PharmD., BCPS Clinical Pharmacist Pager [phone] 01/19/2017 10:15 AM

## 2017-01-19 NOTE — Progress Notes (Signed)
Physical Therapy Treatment Patient Details Name: Donald Dunlap MRN: [id number] DOB: 1951/09/19 Today's Date: 01/19/2017    History of Present Illness Pt adm with acute on chronic systolic heart failure. PMH - ICD, cabg, afib, dm, copd, ckd, cva    PT Comments    Pt able to ambulate 100 ft with rw during PT session. Pt did have increased distance when using rollator walker during previous session. Pt states that he does have his wife and daughter at home to help. Refusing HHPT services. PT to follow acutely to maximize mobility.    Follow Up Recommendations  No PT follow up (pt refused any HHPT services)     Equipment Recommendations  Other (comment) (rollator)    Recommendations for Other Services       Precautions / Restrictions Precautions Precautions: None Restrictions Weight Bearing Restrictions: No    Mobility  Bed Mobility Overal bed mobility: Needs Assistance Bed Mobility: Supine to Sit     Supine to sit: Supervision     General bed mobility comments: using rail and HOB elevated approx. 20 degrees.   Transfers Overall transfer level: Needs assistance Equipment used: Rolling walker (2 wheeled) Transfers: Sit to/from Stand Sit to Stand: Min assist         General transfer comment: cues for hand placement  Ambulation/Gait Ambulation/Gait assistance: Supervision Ambulation Distance (Feet): 100 Feet Assistive device: Rolling walker (2 wheeled) Gait Pattern/deviations: Step-through pattern;Shuffle Gait velocity: slow pattern   General Gait Details: Pt with slow pattern, cues for safe use of walker.    Stairs            Wheelchair Mobility    Modified Rankin (Stroke Patients Only)       Balance Overall balance assessment: Needs assistance Sitting-balance support: No upper extremity supported;Feet supported Sitting balance-Leahy Scale: Good     Standing balance support: During functional activity Standing balance-Leahy Scale: Fair Standing  balance comment: using rw for ambulation                            Cognition Arousal/Alertness: Awake/alert Behavior During Therapy: WFL for tasks assessed/performed Overall Cognitive Status: Within Functional Limits for tasks assessed                                        Exercises      General Comments General comments (skin integrity, edema, etc.): SpO2 staying at or above 96% on 3L, HR elevating to 110 during ambulation, 90s at rest.       Pertinent Vitals/Pain Pain Assessment: No/denies pain    Home Living                      Prior Function            PT Goals (current goals can now be found in the care plan section) Acute Rehab PT Goals Patient Stated Goal: return home PT Goal Formulation: With patient Time For Goal Achievement: 01/20/17 Potential to Achieve Goals: Good Progress towards PT goals: Progressing toward goals    Frequency    Min 2X/week      PT Plan Current plan remains appropriate    Co-evaluation              AM-PAC PT "6 Clicks" Daily Activity  Outcome Measure  Difficulty turning over in bed (including adjusting bedclothes, sheets  and blankets)?: A Little Difficulty moving from lying on back to sitting on the side of the bed? : A Little Difficulty sitting down on and standing up from a chair with arms (e.g., wheelchair, bedside commode, etc,.)?: A Little Help needed moving to and from a bed to chair (including a wheelchair)?: A Little Help needed walking in hospital room?: A Little Help needed climbing 3-5 steps with a railing? : A Lot 6 Click Score: 17    End of Session Equipment Utilized During Treatment: Gait belt;Oxygen Activity Tolerance: Patient limited by fatigue Patient left: with call bell/phone within reach (sitting EOB for breakfast per pt request, nsg aware) Nurse Communication: Mobility status PT Visit Diagnosis: Other abnormalities of gait and mobility (R26.89)     Time:  1610-9604 PT Time Calculation (min) (ACUTE ONLY): 26 min  Charges:  $Gait Training: 8-22 mins $Therapeutic Activity: 8-22 mins                    G Codes:       Christiane Ha, PT, CSCS Pager [phone] Office [phone]    Delton See 01/19/2017, 8:36 AM

## 2017-01-20 LAB — BASIC METABOLIC PANEL
Anion gap: 12 (ref 5–15)
BUN: 42 mg/dL — AB (ref 6–20)
CO2: 28 mmol/L (ref 22–32)
CREATININE: 1.57 mg/dL — AB (ref 0.61–1.24)
Calcium: 10.3 mg/dL (ref 8.9–10.3)
Chloride: 92 mmol/L — ABNORMAL LOW (ref 101–111)
GFR calc Af Amer: 52 mL/min — ABNORMAL LOW (ref >60)
GFR, EST NON AFRICAN AMERICAN: 45 mL/min — AB (ref >60)
Glucose, Bld: 166 mg/dL — ABNORMAL HIGH (ref 65–99)
POTASSIUM: 3.2 mmol/L — AB (ref 3.5–5.1)
SODIUM: 132 mmol/L — AB (ref 135–145)

## 2017-01-20 LAB — COOXEMETRY PANEL
Carboxyhemoglobin: 1.7 % — ABNORMAL HIGH (ref 0.5–1.5)
METHEMOGLOBIN: 1.2 % (ref 0.0–1.5)
O2 Saturation: 74.8 %
TOTAL HEMOGLOBIN: 13 g/dL (ref 12.0–16.0)

## 2017-01-20 LAB — GLUCOSE, CAPILLARY
GLUCOSE-CAPILLARY: 208 mg/dL — AB (ref 65–99)
Glucose-Capillary: 150 mg/dL — ABNORMAL HIGH (ref 65–99)
Glucose-Capillary: 114 mg/dL — ABNORMAL HIGH (ref 65–99)
Glucose-Capillary: 85 mg/dL (ref 65–99)

## 2017-01-20 LAB — CBC
HEMATOCRIT: 40.2 % (ref 39.0–52.0)
Hemoglobin: 12.9 g/dL — ABNORMAL LOW (ref 13.0–17.0)
MCH: 26.8 pg (ref 26.0–34.0)
MCHC: 32.1 g/dL (ref 30.0–36.0)
MCV: 83.4 fL (ref 78.0–100.0)
Platelets: 205 10*3/uL (ref 150–400)
RBC: 4.82 MIL/uL (ref 4.22–5.81)
RDW: 21.2 % — AB (ref 11.5–15.5)
WBC: 12.5 10*3/uL — ABNORMAL HIGH (ref 4.0–10.5)

## 2017-01-20 LAB — PROTIME-INR
INR: 2.3
Prothrombin Time: 25.7 seconds — ABNORMAL HIGH (ref 11.4–15.2)

## 2017-01-20 LAB — MAGNESIUM: Magnesium: 2.1 mg/dL (ref 1.7–2.4)

## 2017-01-20 MED ORDER — METOLAZONE 5 MG PO TABS
2.5000 mg | ORAL_TABLET | Freq: Once | ORAL | Status: AC
  Administered 2017-01-20: 2.5 mg via ORAL
  Filled 2017-01-20: qty 1

## 2017-01-20 MED ORDER — POTASSIUM CHLORIDE CRYS ER 20 MEQ PO TBCR
40.0000 meq | EXTENDED_RELEASE_TABLET | Freq: Once | ORAL | Status: AC
  Administered 2017-01-20: 40 meq via ORAL
  Filled 2017-01-20: qty 2

## 2017-01-20 NOTE — Progress Notes (Signed)
ANTICOAGULATION CONSULT NOTE - Follow Up Consult  Pharmacy Consult for Coumadin Indication: atrial fibrillation  Patient Measurements: Height:  (175.3 cm) Weight: 245 lb 8 oz (111.4 kg) IBW/kg (Calculated) : 70.7  Vital Signs: Temp: 97.7 F (36.5 C) (05/02 0724) Temp Source: Oral (05/02 0724) BP: 117/80 (05/02 0724) Pulse Rate: 87 (05/02 0724)  Labs:  Recent Labs  01/18/17 0500 01/19/17 0500 01/20/17 0524  HGB 12.2* 12.7* 12.9*  HCT 40.4 40.8 40.2  PLT 169 172 205  LABPROT 28.5* 27.2* 25.7*  INR 2.62 2.47 2.30  CREATININE 1.60* 1.53* 1.57*    Estimated Creatinine Clearance: 57.7 mL/min (A) (by C-G formula based on SCr of 1.57 mg/dL (H)).  Assessment: 65yom continues on coumadin for afib. INR previously slightly above goal with peak at 3.18, then down to 2.16 after 4 days of reduced doses, then bump up to 2.3 with home dose. Regimen adjusted 4/25 to give lower doses than PTA.   INR remains stable and at goal today at 2.3. CBC remains stable. No reported s/s bleeding.   PTA dose:  daily except  Mon/Fri  Goal of Therapy:  INR 2-3  Monitor platelets by anticoagulation protocol: Yes   Plan:  1) Continue home dose with coumadin  MWF/2mg  TTSS 2) Daily INR + CBC  Sheppard Coil PharmD., BCPS Clinical Pharmacist Pager [phone] 01/20/2017 7:32 AM

## 2017-01-20 NOTE — Progress Notes (Signed)
Advanced Heart Failure Rounding Note   Subjective:    Admitted 01/07/17 from CHF clinic with volume overload. Started on IV Lasix + milrinone 0.18mcg.   Weight down 20 pounds since admission. Down 4 pounds from yesterday after lasix increased to  BID.   Co ox stable at 75, CVP 6-7. Feels ok today. More lethargic. He denies SOB and chest pain.   RHC 01/15/17 Findings:  RA = 22 RV = 81/19 PA = 88/39 (56) PCW = 32 Fick cardiac output/index = 4.3/1.9 Thermo CO/CI = 4.3/1.9 PVR = 5.6 WU Ao sat = 92% PA sat = 49%, 50%  Objective:   Weight Range:  Vital Signs:   Temp:  [98.3 F (36.8 C)-98.5 F (36.9 C)] 98.3 F (36.8 C) (05/02 0325) Pulse Rate:  [49-107] 107 (05/02 0325) Resp:  [13-22] 22 (05/02 0325) BP: (107-131)/(60-85) 131/77 (05/02 0325) SpO2:  [95 %-100 %] 95 % (05/02 0325) Weight:  [245 lb 8 oz (111.4 kg)] 245 lb 8 oz (111.4 kg) (05/02 0325) Last BM Date: 01/14/17  Weight change: Filed Weights   01/18/17 0503 01/19/17 0351 01/20/17 0325  Weight: 251 lb 11.2 oz (114.2 kg) 249 lb 12.8 oz (113.3 kg) 245 lb 8 oz (111.4 kg)    Intake/Output:   Intake/Output Summary (Last 24 hours) at 01/20/17 4098 Last data filed at 01/20/17 0326  Gross per 24 hour  Intake           1171.6 ml  Output             2400 ml  Net          -1228.4 ml     Physical Exam: General: Older than stated age appearing male, Lying in bed.  HEENT: Normal, poor dentition Neck: supple. JVP 8-9 cm. Carotids 2+ bilat; no bruits. No lymphadenopathy or thryomegaly appreciated. Cor: PMI nondisplaced. Irregular rate and rhythm. No murmurs, rubs or gallops.  Lungs:Normal effort, no wheezes.Clear in upper lobes bilaterally. Fine crackles in bilateral lower lobes.  Abdomen: Soft, nontender, obese. No hepatosplenomegaly. No bruits or masses. Good bowel sounds. Extremities: no cyanosis, clubbing, rash,warm. Compression wraps in place. 2+ edema to knees.   Neuro: alert & orientedx3, cranial  nerves grossly intact. moves all 4 extremities w/o difficulty.Affect pleasant.   Telemetry: Afib, rates in the 90's.    Labs: Basic Metabolic Panel:  Recent Labs Lab 01/14/17 0235  01/16/17 0414 01/17/17 0525 01/18/17 0500 01/19/17 0500 01/20/17 0524  NA 131*  < > 133* 134* 135 134* 132*  K 4.1  < > 3.7 3.3* 3.4* 3.3* 3.2*  CL 94*  < > 96* 94* 95* 95* 92*  CO2 28  < > GLUCOSE 158*  < > 233* 127* 242* 164* 166*  BUN 35*  < > 42* 39* 40* 39* 42*  CREATININE 1.77*  < > 1.70* 1.57* 1.60* 1.53* 1.57*  CALCIUM 10.1  < > 9.3 9.7 9.7 9.9 10.3  MG 2.0  --   --   --   --  1.7  --   < > = values in this interval not displayed.    CBC:  Recent Labs Lab 01/17/17 0525 01/18/17 0500 01/19/17 0500 01/20/17 0524  WBC 8.1 10.3 11.1* 12.5*  HGB 12.4* 12.2* 12.7* 12.9*  HCT 39.8 40.4 40.8 40.2  MCV 85.0 86.3 84.8 83.4  PLT 137* 169 172 205   BNP: BNP (last 3 results)  Recent Labs  01/07/17 1421  BNP  695.6*   Right Heart Cath 01/15/17 Findings:  RA = 22 RV = 81/19 PA = 88/39 (56) PCW = 32 Fick cardiac output/index = 4.3/1.9 Thermo CO/CI = 4.3/1.9 PVR = 5.6 WU Ao sat = 92% PA sat = 49%, 50%  Assessment: 1. Markedly elevated biventricular pressures with severe mixed pulmonary HTN 2. Moderately reduced CO   Medications:     Scheduled Medications: . allopurinol  100 mg Oral Daily  . aspirin EC  81 mg Oral Daily  . atorvastatin  20 mg Oral q1800  . calcitRIOL  0.25 mcg Oral Daily  . carvedilol  3.125 mg Oral BID WC  . glipiZIDE  5 mg Oral BID AC  . insulin aspart  0-20 Units Subcutaneous TID WC  . insulin aspart  15 Units Subcutaneous TID WC  . insulin glargine  25 Units Subcutaneous Daily  . pantoprazole  40 mg Oral BID WC  . potassium chloride  40 mEq Oral BID  . sodium chloride flush  10-40 mL Intracatheter Q12H  . sodium chloride flush  3 mL Intravenous Q12H  . spironolactone  25 mg Oral Daily  . warfarin  2 mg Oral Q T,Th,S,Su-1800    . warfarin  4 mg Oral Q M,W,F-1800  . Warfarin - Pharmacist Dosing Inpatient   Does not apply q1800    Infusions: . sodium chloride 250 mL (01/18/17 1600)  . furosemide Stopped (01/19/17 2115)  . milrinone 0.25 mcg/kg/min (01/20/17 0305)    PRN Medications: sodium chloride, acetaminophen, ondansetron (ZOFRAN) IV, sodium chloride flush, sodium chloride flush, sorbitol   Assessment/Plan/Discussion   1. A/C Systolic Heart Failure / [employer] ICD: ICM, CABG in 2006. NYHA III. Echo this admission with EF 30-35%.  - Volume status improving.  - Continue milrinone 0.44mcg - Continue lasix  BID for today, then can likely switch to po tomorrow. No metolazone today.  - Continue Coreg 3.125 mg BID.  2. CAD  - No angina.  Recent myoview 2017 without ischemia.  - Continue ASA and atorvastatin.  - No change to current plan.  3. Chronic A fib - Rate controlled.   - This patients CHA2DS2-VASc Score and unadjusted Ischemic Stroke Rate (% per year) is equal to 7.2 % stroke rate/year from a score of 5 Above score calculated as 1 point each if present [CHF, HTN, DM, Vascular=MI/PAD/Aortic Plaque, Age if 65-74, or Male], 2 points each if present [Age > 75, or Stroke/TIA/TE] - Continue coumadin for anticoagulation, INR 2.3 4. Acute on CKD Stage III - Creatinine stable at 1.57 - baseline creatinine 1.2-1.4 - Follow with daily BMET  5. Chronic Respiratory Failure  - On Home O2.  - Likely a component of OSA contributing to his hypersomnolence.  - He says he will not wear a CPAP, ABG done showed CO2 of 52.7, pH was normal.  - Improved today, no change to current plan.  6. R and LLE partial thickness wounds - WOC consulted with recommendations. Continue conservative wound care + mild compression. Wounds are improving. No evidence of cellulitis  - No change to current plan.  7. Hypokalemia:  - K 3.2 today - Continue KCl BID.  8. NSVT - Check Mg today.     Little Ishikawa, NP  7:22 AM   Advanced Heart Failure Team  Pager [phone] M-F 7am-4pm.  Please contact [facility] for night-coverage after hours (4p -7a ) and weekends on [email or web address]  Length of Stay: 13   Patient seen and examined with Suzzette Righter,  NP. We discussed all aspects of the encounter. I agree with the assessment and plan as stated above.   CVP and co-ox improved. Still with some LE edema. Would continue IV diuresis one more day. Supp K+. He is anxious for d/c. Hopefully we can get him home tomorrow. He refuses sleep study despite evidence of hypercarbia on ABG.   Arvilla Meres, MD  8:19 PM

## 2017-01-20 NOTE — Progress Notes (Signed)
Faxed oxygen order and Rollator order to [facility] # [phone]. Requested portable and RW be delivered to hospital for dc and HPMS to follow up on oxygen concentrator at home to ensure functioning properly. Isidoro Donning RN CCM Case Mgmt phone [phone]

## 2017-01-20 NOTE — Progress Notes (Signed)
SATURATION QUALIFICATIONS: (This note is used to comply with regulatory documentation for home oxygen)  Patient Saturations on Room Air at Rest = 87%  Patient Saturations on Room Air while Ambulating = Unable to obtain d/t SOB and weakness with ambulation.  Patient Saturations on 2 Liters of oxygen = 94%  Please briefly explain why patient needs home oxygen: CHF, COPD

## 2017-01-21 ENCOUNTER — Telehealth: Payer: Self-pay | Admitting: Internal Medicine

## 2017-01-21 ENCOUNTER — Telehealth (HOSPITAL_COMMUNITY): Payer: Self-pay | Admitting: *Deleted

## 2017-01-21 LAB — RETICULOCYTES
RBC.: 4.98 MIL/uL (ref 4.22–5.81)
RETIC CT PCT: 2.2 % (ref 0.4–3.1)
Retic Count, Absolute: 109.6 10*3/uL (ref 19.0–186.0)

## 2017-01-21 LAB — BASIC METABOLIC PANEL
ANION GAP: 12 (ref 5–15)
BUN: 50 mg/dL — ABNORMAL HIGH (ref 6–20)
CO2: 27 mmol/L (ref 22–32)
Calcium: 10.4 mg/dL — ABNORMAL HIGH (ref 8.9–10.3)
Chloride: 92 mmol/L — ABNORMAL LOW (ref 101–111)
Creatinine, Ser: 1.77 mg/dL — ABNORMAL HIGH (ref 0.61–1.24)
GFR calc Af Amer: 45 mL/min — ABNORMAL LOW (ref >60)
GFR, EST NON AFRICAN AMERICAN: 39 mL/min — AB (ref >60)
Glucose, Bld: 141 mg/dL — ABNORMAL HIGH (ref 65–99)
POTASSIUM: 3.9 mmol/L (ref 3.5–5.1)
SODIUM: 131 mmol/L — AB (ref 135–145)

## 2017-01-21 LAB — IRON AND TIBC
Iron: 32 ug/dL — ABNORMAL LOW (ref 45–182)
SATURATION RATIOS: 10 % — AB (ref 17.9–39.5)
TIBC: 315 ug/dL (ref 250–450)
UIBC: 283 ug/dL

## 2017-01-21 LAB — CBC
HEMATOCRIT: 42.1 % (ref 39.0–52.0)
Hemoglobin: 13.4 g/dL (ref 13.0–17.0)
MCH: 26.7 pg (ref 26.0–34.0)
MCHC: 31.8 g/dL (ref 30.0–36.0)
MCV: 84 fL (ref 78.0–100.0)
PLATELETS: 208 10*3/uL (ref 150–400)
RBC: 5.01 MIL/uL (ref 4.22–5.81)
RDW: 21.5 % — AB (ref 11.5–15.5)
WBC: 11.4 10*3/uL — ABNORMAL HIGH (ref 4.0–10.5)

## 2017-01-21 LAB — VITAMIN B12: VITAMIN B 12: 532 pg/mL (ref 180–914)

## 2017-01-21 LAB — GLUCOSE, CAPILLARY
GLUCOSE-CAPILLARY: 152 mg/dL — AB (ref 65–99)
Glucose-Capillary: 160 mg/dL — ABNORMAL HIGH (ref 65–99)

## 2017-01-21 LAB — COOXEMETRY PANEL
CARBOXYHEMOGLOBIN: 2.4 % — AB (ref 0.5–1.5)
METHEMOGLOBIN: 0.9 % (ref 0.0–1.5)
O2 SAT: 83 %
TOTAL HEMOGLOBIN: 14.1 g/dL (ref 12.0–16.0)

## 2017-01-21 LAB — FOLATE: Folate: 11.4 ng/mL (ref >5.9)

## 2017-01-21 LAB — FERRITIN: Ferritin: 107 ng/mL (ref 24–336)

## 2017-01-21 MED ORDER — SACUBITRIL-VALSARTAN 24-26 MG PO TABS: 1.0000 | ORAL_TABLET | Freq: Two times a day (BID) | ORAL | 3 refills | Status: DC

## 2017-01-21 MED ORDER — CARVEDILOL 3.125 MG PO TABS: 3.1250 mg | ORAL_TABLET | Freq: Two times a day (BID) | ORAL | 3 refills | Status: DC

## 2017-01-21 MED ORDER — SODIUM CHLORIDE 0.9 % IV SOLN
510.0000 mg | Freq: Once | INTRAVENOUS | Status: DC
  Filled 2017-01-21: qty 17

## 2017-01-21 MED ORDER — FUROSEMIDE 80 MG PO TABS: 80.0000 mg | ORAL_TABLET | Freq: Two times a day (BID) | ORAL | 3 refills | Status: DC

## 2017-01-21 MED ORDER — SODIUM CHLORIDE 0.9 % IV SOLN: INTRAVENOUS | Status: DC

## 2017-01-21 MED ORDER — POTASSIUM CHLORIDE CRYS ER 20 MEQ PO TBCR: 40.0000 meq | EXTENDED_RELEASE_TABLET | Freq: Two times a day (BID) | ORAL | 3 refills | Status: DC

## 2017-01-21 MED ORDER — METOLAZONE 2.5 MG PO TABS: ORAL_TABLET | ORAL | 2 refills | Status: AC

## 2017-01-21 MED ORDER — SODIUM CHLORIDE 0.9 % IV SOLN: 510.0000 mg | Freq: Once | INTRAVENOUS | Status: DC

## 2017-01-21 MED ORDER — SACUBITRIL-VALSARTAN 24-26 MG PO TABS
1.0000 | ORAL_TABLET | Freq: Two times a day (BID) | ORAL | Status: DC
  Administered 2017-01-21: 1 via ORAL
  Filled 2017-01-21: qty 1

## 2017-01-21 MED ORDER — POTASSIUM CHLORIDE CRYS ER 20 MEQ PO TBCR
40.0000 meq | EXTENDED_RELEASE_TABLET | Freq: Every day | ORAL | 3 refills | Status: DC
Start: 2017-01-21 — End: 2017-03-11

## 2017-01-21 MED ORDER — SPIRONOLACTONE 25 MG PO TABS: 25.0000 mg | ORAL_TABLET | Freq: Every day | ORAL | 3 refills | Status: DC

## 2017-01-21 NOTE — Care Management Important Message (Signed)
Important Message  Patient Details  Name: Donald Dunlap MRN: [id number] Date of Birth: Mar 19, 1951   Medicare Important Message Given:  Yes    Kristoph Sattler Abena 01/21/2017, 10:37 AM

## 2017-01-21 NOTE — Telephone Encounter (Signed)
Huntley DecSara with Home health called regarding home health referral on patient that was discharged today.  She asked if Dr. Gala RomneyBensimhon would be the ordering physician for home health orders.  I called her back and told her that Dr. Gala RomneyBensimhon would be the ordering physician and provided our contact numbers for the clinic.  No further questions at this time.

## 2017-01-21 NOTE — Discharge Summary (Signed)
Advanced Heart Failure Discharge Note  Discharge Summary   Patient ID: Donald Dunlap MRN: [id number], DOB/AGE: 01/04/1951 66 y.o. Admit date: 01/07/2017 D/C date:     01/21/2017   Primary Discharge Diagnoses:  1. Acute on chronic systolic CHF 2. CAD 3. Chronic atrial fibrillation 4. Acute on chronic kidney disease stage III.  5. Chronic respiratory failure 6. R and LLE partial thickness wounds 7. Hypokalemia 8. NSVT  Hospital Course: Donald Dunlap is a 66 year old male with a past medical history of CAD s/p CABG in 2006 at [facility] complicated by sternal dehiscence, chronic AFib, DM, CVA, CKD, COPD on home O2, gout, OSA, RBBB and ICM.   He was admitted to [facility] 06/11/16-07/01/16 with volume overload diuresed with lasix gtt and metolazone. Discharge weight was 252 pounds. EF 30%.   Admitted from the CHF clinic on 01/07/17 with orthopnea, lower extremity edema. Admission weight was 265 pounds. He was started on IV lasix and metolazone. Initially diuresis was sluggish, so milrinone was added to facilitate diuresis on 01/10/17. RHC on 01/15/17 with Fick cardiac output/index 4.3/1.9. PA sat was 49%. PICC was placed on 01/15/17.  Echo with EF 30-35%, LA severely dilated.   Patient also with bilateral lower extremity wounds - full stasis ulcers. Wounds were treated with foam dressings and ace wraps for light compression. Care will be assumed by home health RN.   He had some NSVT on telemetry, which improved with diuresis and electrolyte management.   He diuresed briskly on IV lasix + metolazone with milrinone. He was started on Entresto this admission. His milrinone was stopped at discharge as he was adamant that he go home. His discharge weight was 243 pounds. We will follow up with him in 2 weeks and he will follow up with Dr. Ladean Raya in [place] next week.    Discharge Weight Range: 243 pounds.  Discharge Vitals: Blood pressure 115/68, pulse 98, temperature 97.9 F (36.6 C), temperature source Oral,  resp. rate (!) 23, height 5\' 9"  (1.753 m), weight 243 lb 14.4 oz (110.6 kg), SpO2 98 %.  Labs: Lab Results  Component Value Date   WBC 11.4 (H) 01/21/2017   HGB 13.4 01/21/2017   HCT 42.1 01/21/2017   MCV 84.0 01/21/2017   PLT 208 01/21/2017     Recent Labs Lab 01/21/17 0508  NA 131*  K 3.9  CL 92*  CO2 27  BUN 50*  CREATININE 1.77*  CALCIUM 10.4*  GLUCOSE 141*   No results found for: CHOL, HDL, LDLCALC, TRIG BNP (last 3 results)  Recent Labs  01/07/17 1421  BNP 695.6*    ProBNP (last 3 results) No results for input(s): PROBNP in the last 8760 hours.   Diagnostic Studies/Procedures  Transthoracic Echocardiography Study Conclusions  - Left ventricle: The cavity size was normal. Wall thickness was   increased in a pattern of severe LVH. Systolic function was   moderately to severely reduced. The estimated ejection fraction   was in the range of 30% to 35%. - Mitral valve: There was mild to moderate regurgitation. - Left atrium: The atrium was severely dilated. - Right atrium: The atrium was moderately dilated.  Right Heart Cath Findings:  RA = 22 RV = 81/19 PA = 88/39 (56) PCW = 32 Fick cardiac output/index = 4.3/1.9 Thermo CO/CI = 4.3/1.9 PVR = 5.6 WU Ao sat = 92% PA sat = 49%, 50%   Discharge Medications   Allergies as of 01/21/2017      Reactions  Latex    Rash   Oxycodone    hallucinations      Medication List    TAKE these medications   acetaminophen 500 MG tablet Commonly known as:  TYLENOL Take 500 mg by mouth every 6 (six) hours as needed.   allopurinol 100 MG tablet Commonly known as:  ZYLOPRIM Take 100 mg by mouth daily.   aspirin 81 MG EC tablet Take 81 mg by mouth daily. Swallow whole.   atorvastatin 20 MG tablet Commonly known as:  LIPITOR Take 20 mg by mouth daily.   calcitRIOL 0.25 MCG capsule Commonly known as:  ROCALTROL Take 0.25 mcg by mouth daily.   carvedilol 3.125 MG tablet Commonly known as:   COREG Take 1 tablet (3.125 mg total) by mouth 2 (two) times daily with a meal. What changed:  medication strength  how much to take   dimenhyDRINATE 50 MG tablet Commonly known as:  DRAMAMINE Take 50-100 mg by mouth every 8 (eight) hours as needed for nausea.   diphenhydrAMINE 25 MG tablet Commonly known as:  BENADRYL Take 25 mg by mouth every 6 (six) hours as needed.   furosemide 80 MG tablet Commonly known as:  LASIX Take 1 tablet (80 mg total) by mouth 2 (two) times daily. What changed:  medication strength  how much to take   glipiZIDE 5 MG tablet Commonly known as:  GLUCOTROL Take 5 mg by mouth 2 (two) times daily before a meal.   insulin glargine 100 UNIT/ML injection Commonly known as:  LANTUS Inject 25 Units into the skin daily.   insulin lispro 100 UNIT/ML injection Commonly known as:  HUMALOG Inject 15 Units into the skin 3 (three) times daily before meals.   metolazone 2.5 MG tablet Commonly known as:  ZAROXOLYN Take one tablet as needed for weight gain greater than 3 pounds in one day or 5 pounds in one week. What changed:  how much to take  how to take this  when to take this  additional instructions   pantoprazole 40 MG tablet Commonly known as:  PROTONIX Take 40 mg by mouth daily.   potassium chloride SA 20 MEQ tablet Commonly known as:  K-DUR,KLOR-CON Take 2 tablets (40 mEq total) by mouth daily.   sacubitril-valsartan 24-26 MG Commonly known as:  ENTRESTO Take 1 tablet by mouth 2 (two) times daily.   spironolactone 25 MG tablet Commonly known as:  ALDACTONE Take 1 tablet (25 mg total) by mouth daily.   warfarin 4 MG tablet Commonly known as:  COUMADIN Take 4 mg by mouth daily. 1/2 tab Monday and Friday and  One whole tab other days            Durable Medical Equipment        Start     Ordered   01/20/17 1622  For home use only DME 4 wheeled rolling walker with seat  Once    Question:  Patient needs a walker to treat  with the following condition  Answer:  CHF (congestive heart failure) (HCC)   01/20/17 1621   01/20/17 1620  For home use only DME oxygen  Once    Comments:  Evaluate for Light Weight Portable  Question Answer Comment  Mode or (Route) Nasal cannula   Liters per Minute 2   Frequency Continuous (stationary and portable oxygen unit needed)   Oxygen delivery system Gas      01/20/17 1620      Disposition   The patient will  be discharged in stable condition to home. Discharge Instructions    (HEART FAILURE PATIENTS) Call MD:  Anytime you have any of the following symptoms: 1) 3 pound weight gain in 24 hours or 5 pounds in 1 week 2) shortness of breath, with or without a dry hacking cough 3) swelling in the hands, feet or stomach 4) if you have to sleep on extra pillows at night in order to breathe.    Complete by:  As directed    Diet - low sodium heart healthy    Complete by:  As directed    Heart Failure patients record your daily weight using the same scale at the same time of day    Complete by:  As directed    Increase activity slowly    Complete by:  As directed    STOP any activity that causes chest pain, shortness of breath, dizziness, sweating, or exessive weakness    Complete by:  As directed      Follow-up Information     [facility] Follow up on 01/28/2017.   Specialty:  Cardiology Why:  at 2:00pm for hospital follow up with Dr. Prescott Gum NP. Garage code [id number].  Contact information: [address] [id number] mc [place] [address] [phone]            Duration of Discharge Encounter: Greater than 35 minutes   Signed, EREN RYSER, NP 01/21/2017, 1:33 PM   Patient seen and examined with Suzzette Righter, NP. We discussed all aspects of the encounter. I agree with the assessment and plan as stated above.   He is improved with diuresis. Will need to watch closely to avoid recurrent volume overload. We  have faxed his d/c summary to his primary cardiologist Dr. Ladean Raya in [place]. He will reassess renal function in the near future. We will see him back in the HF Clinic soon.   Arvilla Meres, MD  8:49 PM

## 2017-01-21 NOTE — Telephone Encounter (Signed)
Pt is currently in the hospital and this message needs to go to the hospital, our office did not send in this medication today. Thank you

## 2017-01-21 NOTE — Telephone Encounter (Signed)
New Message:   Please call,2 Potassium prescriptions were sent over this morning,they had different sig code.

## 2017-01-21 NOTE — Progress Notes (Signed)
Heart Failure Navigator Consult Note  Presentation: per Dr Gala Romney Zyan Coby is a 66 year old with a history of obesity, CAD with CABG in 2006 at [facility] complicated by sternal dehiscence, chronic A fib, DMII, CVA, CKD, COPD on home O2, gout, OSA, RBBB, and ICM.  Last LHC 2006.   Hospitalized at [facility] 2017 with volume overload. Diuresed over 50 pounds. Discharge weight 260 pounds.   Prior to admit home diuretic regimen was adjusted but he continue to have ongoing issues with edema and weight gain. Eats lots of ice. Says he has been SOB with exertion. He also reports increased leg edema with blisters.   Earlier today he presented to the HF clinic with increased dyspnea and edema. Admitted with A/C systolic heart failure.    Past Medical History:  Diagnosis Date  . Atrial fibrillation, permanent (HCC)   . Blood in stool   . Chronic systolic congestive heart failure, NYHA class 3 (HCC)   . Diabetes (HCC)   . Edema   . Esophageal reflux   . Gout   . HLD (hyperlipidemia)   . HTN (hypertension)   . Iron deficiency anemia 10/02/2016  . Ischemic cardiomyopathy   . Migraine aura, persistent   . Morbid obesity with BMI of 40.0-44.9, adult (HCC)   . OSA (obstructive sleep apnea)   . Peptic ulcer   . RBBB LAFB   . Venous insufficiency     Social History   Social History  . Marital status: Married    Spouse name: N/A  . Number of children: N/A  . Years of education: N/A   Social History Main Topics  . Smoking status: Never Smoker  . Smokeless tobacco: Never Used  . Alcohol use No  . Drug use: No  . Sexual activity: Not Asked   Other Topics Concern  . None   Social History Narrative  . None    ECHO: Study Conclusions-01/08/17  - Left ventricle: The cavity size was normal. Wall thickness was   increased in a pattern of severe LVH. Systolic function was   moderately to severely reduced. The estimated ejection fraction   was in the range of 30% to 35%. - Mitral valve:  There was mild to moderate regurgitation. - Left atrium: The atrium was severely dilated. - Right atrium: The atrium was moderately dilated.  ------------------------------------------------------------------- Study data:  Comparison was made to the study of 10/09/2014.  Study status:  Routine.  Procedure:  The patient reported no pain pre or post test. Transthoracic echocardiography. Image quality was poor. The study was technically difficult, as a result of poor acoustic windows and body habitus.  Study completion:  There were no complications.          Transthoracic echocardiography.  M-mode, complete 2D, spectral Doppler, and color Doppler.  Birthdate: Patient birthdate: 1951/06/19.  Age:  Patient is 66 yr old.  Sex: Gender: male.    BMI: 39.1 kg/m^2.  Blood pressure:     116/89 Patient status:  Inpatient.  Study date:  Study date: 01/08/2017. Study time: 10:21 AM.  Location:  Bedside  BNP    Component Value Date/Time   BNP 695.6 (H) 01/07/2017 1421    ProBNP No results found for: PROBNP   Education Assessment and Provision:  Detailed education and instructions provided on heart failure disease management including the following:  Signs and symptoms of Heart Failure When to call the physician Importance of daily weights Low sodium diet Fluid restriction Medication management Anticipated future follow-up  appointments  Patient education given on each of the above topics.  Patient acknowledges understanding and acceptance of all instructions.  I spoke briefly with patient regarding his HF and HF recommendations for home.  I provided Entresto samples through the AHF Clinic Pharm D as patient had used the 30 day free card in the past and says he cannot afford co- pay at the pharmacy.  He has plans to [place] and AHF Clinic.  Education Materials:  "Living Better With Heart Failure" Booklet, Daily Weight Tracker Tool   High Risk Criteria  for Readmission and/or Poor Patient Outcomes:  (Recommend Follow-up with Advanced Heart Failure Clinic)--yes   EF <30%- 30-35%  2 or more admissions in 6 months- No  Difficult social situation- No denies  Demonstrates medication noncompliance-denies    Barriers of Care:  Knowledge and compliance  Discharge Planning:   Plans to return to [place].

## 2017-01-21 NOTE — Progress Notes (Signed)
Advanced Heart Failure Rounding Note   Subjective:    Admitted 01/07/17 from CHF clinic with volume overload. Started on IV Lasix + milrinone 0.70mcg.   Feels good. Wants to go home today. Will not stay longer. CVP 9-10. Denies SOB or orthopnea. Weight down to 243 (23 pounds down)   RHC 01/15/17 Findings:  RA = 22 RV = 81/19 PA = 88/39 (56) PCW = 32 Fick cardiac output/index = 4.3/1.9 Thermo CO/CI = 4.3/1.9 PVR = 5.6 WU Ao sat = 92% PA sat = 49%, 50%  Objective:   Weight Range:  Vital Signs:   Temp:  [97.6 F (36.4 C)-98.4 F (36.9 C)] 97.6 F (36.4 C) (05/03 0411) Pulse Rate:  [81-107] 107 (05/03 0411) Resp:  [17-28] 19 (05/03 0411) BP: (86-126)/(43-81) 126/81 (05/03 0411) SpO2:  [92 %-99 %] 92 % (05/03 0411) Weight:  [110.6 kg (243 lb 14.4 oz)] 110.6 kg (243 lb 14.4 oz) (05/03 0411) Last BM Date: 01/20/17  Weight change: Filed Weights   01/19/17 0351 01/20/17 0325 01/21/17 0411  Weight: 113.3 kg (249 lb 12.8 oz) 111.4 kg (245 lb 8 oz) 110.6 kg (243 lb 14.4 oz)    Intake/Output:   Intake/Output Summary (Last 24 hours) at 01/21/17 0713 Last data filed at 01/21/17 0600  Gross per 24 hour  Intake            552.4 ml  Output             2050 ml  Net          -1497.6 ml     Physical Exam: General:  Sitting on side of bed No resp difficulty HEENT: normal Neck: supple. no JVP 10. Carotids 2+ bilat; no bruits. No lymphadenopathy or thryomegaly appreciated. Cor: PMI nondisplaced. IRR IRR No rubs, gallops or murmurs. Lungs: clear Abdomen: obese soft, nontender, nondistended. No hepatosplenomegaly. No bruits or masses. Good bowel sounds. Extremities: no cyanosis, clubbing, rash, trace - 1+ edema  venous stasis changes Neuro: alert & orientedx3, cranial nerves grossly intact. moves all 4 extremities w/o difficulty. Affect pleasant  Telemetry: Afib, rates in the 80-90s.  Personally reviewed    Labs: Basic Metabolic Panel:  Recent Labs Lab 01/17/17 0525  01/18/17 0500 01/19/17 0500 01/20/17 0500 01/20/17 0524 01/21/17 0508  NA 134* 135 134*  --  132* 131*  K 3.3* 3.4* 3.3*  --  3.2* 3.9  CL 94* 95* 95*  --  92* 92*  CO2 29 29 31   --  28 27  GLUCOSE 127* 242* 164*  --  166* 141*  BUN 39* 40* 39*  --  42* 50*  CREATININE 1.57* 1.60* 1.53*  --  1.57* 1.77*  CALCIUM 9.7 9.7 9.9  --  10.3 10.4*  MG  --   --  1.7 2.1  --   --       CBC:  Recent Labs Lab 01/17/17 0525 01/18/17 0500 01/19/17 0500 01/20/17 0524 01/21/17 0508  WBC 8.1 10.3 11.1* 12.5* 11.4*  HGB 12.4* 12.2* 12.7* 12.9* 13.4  HCT 39.8 40.4 40.8 40.2 42.1  MCV 85.0 86.3 84.8 83.4 84.0  PLT 137* 169 172 205 208   BNP: BNP (last 3 results)  Recent Labs  01/07/17 1421  BNP 695.6*   Right Heart Cath 01/15/17 Findings:  RA = 22 RV = 81/19 PA = 88/39 (56) PCW = 32 Fick cardiac output/index = 4.3/1.9 Thermo CO/CI = 4.3/1.9 PVR = 5.6 WU Ao sat = 92% PA sat =  49%, 50%  Assessment: 1. Markedly elevated biventricular pressures with severe mixed pulmonary HTN 2. Moderately reduced CO   Medications:     Scheduled Medications: . allopurinol  100 mg Oral Daily  . aspirin EC  81 mg Oral Daily  . atorvastatin  20 mg Oral q1800  . calcitRIOL  0.25 mcg Oral Daily  . carvedilol  3.125 mg Oral BID WC  . glipiZIDE  5 mg Oral BID AC  . insulin aspart  0-20 Units Subcutaneous TID WC  . insulin aspart  15 Units Subcutaneous TID WC  . insulin glargine  25 Units Subcutaneous Daily  . pantoprazole  40 mg Oral BID WC  . potassium chloride  40 mEq Oral BID  . sodium chloride flush  10-40 mL Intracatheter Q12H  . sodium chloride flush  3 mL Intravenous Q12H  . spironolactone  25 mg Oral Daily  . warfarin  2 mg Oral Q T,Th,S,Su-1800  . warfarin  4 mg Oral Q M,W,F-1800  . Warfarin - Pharmacist Dosing Inpatient   Does not apply q1800    Infusions: . sodium chloride 250 mL (01/18/17 1600)  . ferumoxytol    . furosemide 120 mg (01/20/17 1723)  . milrinone  0.25 mcg/kg/min (01/21/17 0600)    PRN Medications: sodium chloride, acetaminophen, ondansetron (ZOFRAN) IV, sodium chloride flush, sodium chloride flush, sorbitol   Assessment/Plan/Discussion   1. A/C Systolic Heart Failure / Med[employer]onic ICD: ICM, CABG in 2006. NYHA III. Echo this admission with EF 30-35%.  2. CAD  3. Chronic A fib - Rate controlled.   - This patients CHA2DS2-VASc Score and unadjusted Ischemic Stroke Rate (% per year) is equal to 7.2 % stroke rate/year from a score of 5 Above score calculated as 1 point each if present [CHF, HTN, DM, Vascular=MI/PAD/Aortic Plaque, Age if 65-74, or Male], 2 points each if present [Age > 75, or Stroke/TIA/TE] - Continue coumadin for anticoagulation 4. Acute on CKD Stage III 5. Chronic Respiratory Failure  6. R and LLE partial thickness wounds 7. Hypokalemia:  8. NSVT   Much improved on IV milrinone and with aggressive diuresis. Probably still has some room to move but he is adamant that he wants to go home today.   Will stop milrinone and IV lasix. Was previously on lasix 60 bid and says it was working well in past  Will send home on  Lasix 80 po bid Coumadin  Carvedilol 3.125 bid Entresto 24/26  Spiro 25 atorva 20 Metolazone only prn  F/u with Dr. Ladean [place]Srinvisan [place] next week (I sent him a message) and us in 2 weeks. Needs BMET next week.   Arvilla MeresBensimhon, Daniel, MD  7:13 AM  Advanced Heart Failure Team  Pager [phone] M-F 7am-4pm.  Please contact [facility] for night-coverage after hours (4p -7a ) and weekends on [email or web address]  Length of Stay: 14

## 2017-01-21 NOTE — Discharge Instructions (Signed)
Please follow up with Dr.Srinvisan next week.   Home health RN to change dressings on wounds on Mon, Wed, and Friday. Per wound care orders - Foam dressings to protect wounds and decrease adherence over the wound and reduce discomfort with dressing changes; continue kerlex and ace wraps for light compression.

## 2017-01-21 NOTE — Progress Notes (Signed)
Physical Therapy Treatment Patient Details Name: Marcos EkeJimmy Headen MRN: [id number] DOB: 1951/02/26 Today's Date: 01/21/2017    History of Present Illness Pt adm with acute on chronic systolic heart failure. PMH - ICD, cabg, afib, dm, copd, ckd, cva    PT Comments    Pt making good progress. Pt eager and ready to go home.   Follow Up Recommendations  No PT follow up (pt refused any HHPT services)     Equipment Recommendations  Other (comment) (rollator)    Recommendations for Other Services       Precautions / Restrictions Precautions Precautions: None Restrictions Weight Bearing Restrictions: No    Mobility  Bed Mobility Overal bed mobility: Modified Independent Bed Mobility: Sit to Sidelying         Sit to sidelying: Modified independent (Device/Increase time) General bed mobility comments: Incr time  Transfers Overall transfer level: Modified independent Equipment used: 4-wheeled walker Transfers: Sit to/from Stand Sit to Stand: Modified independent (Device/Increase time)         General transfer comment: Incr time  Ambulation/Gait Ambulation/Gait assistance: Supervision Ambulation Distance (Feet): 150 Feet Assistive device: 4-wheeled walker Gait Pattern/deviations: Step-through pattern;Decreased stride length;Trunk flexed Gait velocity: decr Gait velocity interpretation: Below normal speed for age/gender General Gait Details: Steady, slow gait with rollator.   Stairs            Wheelchair Mobility    Modified Rankin (Stroke Patients Only)       Balance Overall balance assessment: Needs assistance Sitting-balance support: No upper extremity supported;Feet supported Sitting balance-Leahy Scale: Good     Standing balance support: During functional activity;No upper extremity supported Standing balance-Leahy Scale: Fair                              Cognition Arousal/Alertness: Awake/alert Behavior During Therapy: WFL for tasks  assessed/performed Overall Cognitive Status: Within Functional Limits for tasks assessed                                        Exercises      General Comments        Pertinent Vitals/Pain Pain Assessment: No/denies pain    Home Living                      Prior Function            PT Goals (current goals can now be found in the care plan section) Progress towards PT goals: Progressing toward goals    Frequency    Min 2X/week      PT Plan Current plan remains appropriate    Co-evaluation              AM-PAC PT "6 Clicks" Daily Activity  Outcome Measure  Difficulty turning over in bed (including adjusting bedclothes, sheets and blankets)?: A Little Difficulty moving from lying on back to sitting on the side of the bed? : A Little Difficulty sitting down on and standing up from a chair with arms (e.g., wheelchair, bedside commode, etc,.)?: A Little Help needed moving to and from a bed to chair (including a wheelchair)?: None Help needed walking in hospital room?: None Help needed climbing 3-5 steps with a railing? : A Lot 6 Click Score: 19    End of Session Equipment Utilized During Treatment: Oxygen Activity Tolerance: Patient tolerated  treatment well Patient left: with call bell/phone within reach;in bed Nurse Communication: Mobility status PT Visit Diagnosis: Other abnormalities of gait and mobility (R26.89)     Time: 1610-9604 PT Time Calculation (min) (ACUTE ONLY): 11 min  Charges:  $Gait Training: 8-22 mins                    G Codes:       [facility] PT [phone]    Angelina Ok [facility] 01/21/2017, 9:45 AM

## 2017-01-22 ENCOUNTER — Encounter (HOSPITAL_COMMUNITY): Admit: 2017-01-22 | Payer: Medicare Other

## 2017-01-22 DIAGNOSIS — N183 Chronic kidney disease, stage 3 (moderate): Secondary | ICD-10-CM

## 2017-01-22 DIAGNOSIS — I130 Hypertensive heart and chronic kidney disease with heart failure and stage 1 through stage 4 chronic kidney disease, or unspecified chronic kidney disease: Secondary | ICD-10-CM | POA: Diagnosis not present

## 2017-01-22 DIAGNOSIS — L89890 Pressure ulcer of other site, unstageable: Secondary | ICD-10-CM | POA: Diagnosis not present

## 2017-01-22 DIAGNOSIS — I5023 Acute on chronic systolic (congestive) heart failure: Secondary | ICD-10-CM | POA: Diagnosis not present

## 2017-01-22 DIAGNOSIS — E1122 Type 2 diabetes mellitus with diabetic chronic kidney disease: Secondary | ICD-10-CM | POA: Diagnosis not present

## 2017-01-25 ENCOUNTER — Telehealth (HOSPITAL_COMMUNITY): Payer: Self-pay | Admitting: Pharmacist

## 2017-01-25 NOTE — Telephone Encounter (Signed)
Entresot 24-26 mg BID PA approved by [facility] Part D through 01/21/19.   Tyler DeisErika K. Bonnye FavaNicolsen, PharmD, BCPS, CPP Clinical Pharmacist Pager: [phone] Phone: [phone] 01/25/2017 9:30 AM

## 2017-01-27 ENCOUNTER — Telehealth (HOSPITAL_COMMUNITY): Payer: Self-pay | Admitting: *Deleted

## 2017-01-27 NOTE — Telephone Encounter (Signed)
FYI: Home health Physical Therapist called stating patient refused physical therapy.  Message routed to Dr.Bensimhon

## 2017-01-28 ENCOUNTER — Ambulatory Visit (HOSPITAL_COMMUNITY)
Admit: 2017-01-28 | Discharge: 2017-01-28 | Disposition: A | Discharge status: Home / Self Care | Payer: Medicare Other | Source: Ambulatory Visit | Attending: Internal Medicine | Admitting: Internal Medicine

## 2017-01-28 VITALS — BP 110/74 | HR 74

## 2017-01-28 DIAGNOSIS — E1122 Type 2 diabetes mellitus with diabetic chronic kidney disease: Secondary | ICD-10-CM | POA: Insufficient documentation

## 2017-01-28 DIAGNOSIS — E785 Hyperlipidemia, unspecified: Secondary | ICD-10-CM | POA: Insufficient documentation

## 2017-01-28 DIAGNOSIS — Z7901 Long term (current) use of anticoagulants: Secondary | ICD-10-CM | POA: Diagnosis not present

## 2017-01-28 DIAGNOSIS — M109 Gout, unspecified: Secondary | ICD-10-CM | POA: Diagnosis not present

## 2017-01-28 DIAGNOSIS — Z8711 Personal history of peptic ulcer disease: Secondary | ICD-10-CM | POA: Diagnosis not present

## 2017-01-28 DIAGNOSIS — I255 Ischemic cardiomyopathy: Secondary | ICD-10-CM | POA: Insufficient documentation

## 2017-01-28 DIAGNOSIS — K219 Gastro-esophageal reflux disease without esophagitis: Secondary | ICD-10-CM | POA: Diagnosis not present

## 2017-01-28 DIAGNOSIS — N189 Chronic kidney disease, unspecified: Secondary | ICD-10-CM | POA: Diagnosis not present

## 2017-01-28 DIAGNOSIS — Z951 Presence of aortocoronary bypass graft: Secondary | ICD-10-CM | POA: Insufficient documentation

## 2017-01-28 DIAGNOSIS — Z7982 Long term (current) use of aspirin: Secondary | ICD-10-CM | POA: Insufficient documentation

## 2017-01-28 DIAGNOSIS — Z8673 Personal history of transient ischemic attack (TIA), and cerebral infarction without residual deficits: Secondary | ICD-10-CM | POA: Diagnosis not present

## 2017-01-28 DIAGNOSIS — G4733 Obstructive sleep apnea (adult) (pediatric): Secondary | ICD-10-CM | POA: Insufficient documentation

## 2017-01-28 DIAGNOSIS — Z794 Long term (current) use of insulin: Secondary | ICD-10-CM | POA: Diagnosis not present

## 2017-01-28 DIAGNOSIS — I872 Venous insufficiency (chronic) (peripheral): Secondary | ICD-10-CM | POA: Diagnosis not present

## 2017-01-28 DIAGNOSIS — I5022 Chronic systolic (congestive) heart failure: Secondary | ICD-10-CM

## 2017-01-28 DIAGNOSIS — I482 Chronic atrial fibrillation, unspecified: Secondary | ICD-10-CM

## 2017-01-28 DIAGNOSIS — I130 Hypertensive heart and chronic kidney disease with heart failure and stage 1 through stage 4 chronic kidney disease, or unspecified chronic kidney disease: Secondary | ICD-10-CM | POA: Diagnosis not present

## 2017-01-28 DIAGNOSIS — Z6841 Body Mass Index (BMI) 40.0 and over, adult: Secondary | ICD-10-CM | POA: Diagnosis not present

## 2017-01-28 DIAGNOSIS — J9610 Chronic respiratory failure, unspecified whether with hypoxia or hypercapnia: Secondary | ICD-10-CM | POA: Diagnosis not present

## 2017-01-28 DIAGNOSIS — Z79899 Other long term (current) drug therapy: Secondary | ICD-10-CM | POA: Insufficient documentation

## 2017-01-28 DIAGNOSIS — I5023 Acute on chronic systolic (congestive) heart failure: Secondary | ICD-10-CM | POA: Diagnosis present

## 2017-01-28 DIAGNOSIS — N184 Chronic kidney disease, stage 4 (severe): Secondary | ICD-10-CM

## 2017-01-28 DIAGNOSIS — Z9981 Dependence on supplemental oxygen: Secondary | ICD-10-CM | POA: Insufficient documentation

## 2017-01-28 DIAGNOSIS — I2510 Atherosclerotic heart disease of native coronary artery without angina pectoris: Secondary | ICD-10-CM | POA: Diagnosis not present

## 2017-01-28 LAB — BASIC METABOLIC PANEL
ANION GAP: 9 (ref 5–15)
BUN: 68 mg/dL — ABNORMAL HIGH (ref 6–20)
CO2: 22 mmol/L (ref 22–32)
Calcium: 9.9 mg/dL (ref 8.9–10.3)
Chloride: 101 mmol/L (ref 101–111)
Creatinine, Ser: 1.92 mg/dL — ABNORMAL HIGH (ref 0.61–1.24)
GFR, EST AFRICAN AMERICAN: 41 mL/min — AB (ref >60)
GFR, EST NON AFRICAN AMERICAN: 35 mL/min — AB (ref >60)
Glucose, Bld: 107 mg/dL — ABNORMAL HIGH (ref 65–99)
POTASSIUM: 5.1 mmol/L (ref 3.5–5.1)
SODIUM: 132 mmol/L — AB (ref 135–145)

## 2017-01-28 LAB — BRAIN NATRIURETIC PEPTIDE: B NATRIURETIC PEPTIDE 5: 499.5 pg/mL — AB (ref 0.0–100.0)

## 2017-01-28 NOTE — Progress Notes (Signed)
ADVANCED HF CLINIC NEW PATIENT CONSULT    Patient ID: Donald Dunlap, male   DOB: [birth year], 66 y.o.   MRN: [id number] WUJ:WJXBJYPCP:Sydney harris Primary Cardiologist: Dr Hyacinth MeekerMiller , [facility] Referring: Graciela HusbandsKlein (EP)  HPI: Donald Dunlap is a 66 year old referred to the HF clinic by Dr Graciela HusbandsKlein. Donald Dunlap was seen by Dr Graciela HusbandsKlein 11/06/14 for ICD and was requesting more information about LVAD. He is referred by Dr. Graciela HusbandsKlein to the HF Clinic for further evaluation.   Donald Dunlap has a history of CAD with [facility] complicated by sternal dehiscence,chronic atrial fib not on anticoagulation due to cost and risks, h/o multiple strokes, COPD on home O2, DM2, gout, ICM, obesity, OSA, RBBB, esophageal reflux and venous insufficiency.   Last LHC 2006.   [facility]. He apparently has an ejection fraction between 20-30%. He has renal insufficiency with a creatinine of 1.5-  2.0.  Admitted 01/07/17-01/21/17 with volume overload and low output. RHC on 01/15/17 with Fick cardiac output/index 4.3/1.9. Echo with EF 30-35%, LA severely dilated.  Co ox 49%. Milrinone started to facilitate diuresis. He was started on Entresto. Discharge weight 243 pounds.   He returns today for post hospital follow up. Upon arrival his CBG was 3844, he was given a snack and blood sugar. Weights at home 250-254 pounds. Drinking 64 ounces of water a day, eating high salt foods. Cannot walk around his house without SOB. SOB with dressing and showering, this is his baseline.  Does not smoke or drink ETOH. He took a metolazone this week for a 3 pound weight gain. Denies chest pain, palpitations. He sleeps in a recliner, if he lays on the bed he needs 4 pillows.    Past Medical History:  Diagnosis Date  . Atrial fibrillation, permanent (HCC)   . Blood in stool   . Chronic systolic congestive heart failure, NYHA class 3 (HCC)   . Diabetes (HCC)   . Edema   . Esophageal reflux   . Gout   . HLD (hyperlipidemia)   . HTN  (hypertension)   . Iron deficiency anemia 10/02/2016  . Ischemic cardiomyopathy   . Migraine aura, persistent   . Morbid obesity with BMI of 40.0-44.9, adult (HCC)   . OSA (obstructive sleep apnea)   . Peptic ulcer   . RBBB LAFB   . Venous insufficiency      SH:  Social History   Social History  . Marital status: Married    Spouse name: N/A  . Number of children: N/A  . Years of education: N/A   Occupational History  . Not on file.   Social History Main Topics  . Smoking status: Never Smoker  . Smokeless tobacco: Never Used  . Alcohol use No  . Drug use: No  . Sexual activity: Not on file   Other Topics Concern  . Not on file   Social History Narrative  . No narrative on file    FH: No h/o familial cardiomyopathy or connective tissue disease   Current Outpatient Prescriptions  Medication Sig Dispense Refill  . acetaminophen (TYLENOL) 500 MG tablet Take 500 mg by mouth every 6 (six) hours as needed.    Marland Kitchen. allopurinol (ZYLOPRIM) 100 MG tablet Take 100 mg by mouth daily.    Marland Kitchen. aspirin 81 MG EC tablet Take 81 mg by mouth daily. Swallow whole.    Marland Kitchen. atorvastatin (LIPITOR) 20 MG tablet Take 20 mg by mouth daily.    .Marland Kitchen  calcitRIOL (ROCALTROL) 0.25 MCG capsule Take 0.25 mcg by mouth daily.     . carvedilol (COREG) 3.125 MG tablet Take 1 tablet (3.125 mg total) by mouth 2 (two) times daily with a meal. 60 tablet 3  . dimenhyDRINATE (DRAMAMINE) 50 MG tablet Take 50-100 mg by mouth every 8 (eight) hours as needed for nausea.    . diphenhydrAMINE (BENADRYL) 25 MG tablet Take 25 mg by mouth every 6 (six) hours as needed.    . furosemide (LASIX) 80 MG tablet Take 1 tablet (80 mg total) by mouth 2 (two) times daily. 60 tablet 3  . glipiZIDE (GLUCOTROL) 5 MG tablet Take 5 mg by mouth 2 (two) times daily before a meal.     . insulin glargine (LANTUS) 100 UNIT/ML injection Inject 25 Units into the skin daily.     . insulin lispro (HUMALOG) 100 UNIT/ML injection Inject 15 Units into the  skin 3 (three) times daily before meals.    . metolazone (ZAROXOLYN) 2.5 MG tablet Take one tablet as needed for weight gain greater than 3 pounds in one day or 5 pounds in one week. 30 tablet 2  . pantoprazole (PROTONIX) 40 MG tablet Take 40 mg by mouth daily.     . potassium chloride SA (K-DUR,KLOR-CON) 20 MEQ tablet Take 2 tablets (40 mEq total) by mouth daily. 60 tablet 3  . sacubitril-valsartan (ENTRESTO) 24-26 MG Take 1 tablet by mouth 2 (two) times daily. 60 tablet 3  . spironolactone (ALDACTONE) 25 MG tablet Take 1 tablet (25 mg total) by mouth daily. 30 tablet 3  . warfarin (COUMADIN) 4 MG tablet Take 4 mg by mouth daily. 1/2 tab Monday and Friday and  One whole tab other days     No current facility-administered medications for this encounter.     Vitals:   01/28/17 1416  BP: 110/74  Pulse: 74  SpO2: 95%    PHYSICAL EXAM: General:  Chronically ill appearing. Arrived in wheelchair.  HEENT: normal Neck: supple. JVP 5 cm.  Carotids 2+ bilaterally; no bruits. No lymphadenopathy or thryomegaly appreciated. Cor: PMI normal. Irregular rate and rhythm.. No rubs, gallops or murmurs. Sternal scar. No s3.  Lungs: clear in upper lobes bilaterally. Diminished in bases.  Abdomen: obese, soft, nontender,  Mildly distended. No hepatosplenomegaly. No bruits or masses. Good bowel sounds. Extremities: no cyanosis, clubbing, rash. Chronic hyperpigmetation noted on the anterior aspects of RLE and LLE. Legs wrapped in ace bandages for compression.   Neuro: alert & orientedx3, cranial nerves grossly intact. Moves all 4 extremities w/o difficulty. Affect pleasant.   ASSESSMENT & PLAN: 1. Chronic Systolic HF: ICM, Echo 12/2016 with EF 30-35%.   - NYHA IIIB - Volume status stable today, continue 80mg  Lasix BID.  - Continue metolazone 2.5mg  as needed  - Continue Spiro 25mg  daily.  - Continue Coreg 3.125mg  BID - Continue Entresto 24/26mg  BID.  - BP too soft to titrate up medications.   -  Reinforced diet restrictions, and 2L fluid restriction. - BNP today.   2. ICM: with history of CAD in 2006 - Denies chest pain.  - Continue ASA   3. Chronic A fib  -CHADSVasc - 6 - continue warfarin.  4. DM: Per PCP 5. CKD: Baseline creatinine seems to be 1.6-1.8  - BMET today.  6. Acute gout 7. Chronic respiratory failure - Stable.   BMET, BNP today. He will see his primary cardiologist next week. Follow up with [place] in 6 weeks.   Little Ishikawa  NP-C  2:37 PM

## 2017-01-28 NOTE — Patient Instructions (Signed)
Labs today We will only contact you if something comes back abnormal or we need to make some changes. Otherwise no news is good news!   Your physician recommends that you schedule a follow-up appointment in: 6 weeks with Dr Bensimhon  Do the following things EVERYDAY: 1) Weigh yourself in the morning before breakfast. Write it down and keep it in a log. 2) Take your medicines as prescribed 3) Eat low salt foods-Limit salt (sodium) to 2000 mg per day.  4) Stay as active as you can everyday 5) Limit all fluids for the day to less than 2 liters   

## 2017-02-03 ENCOUNTER — Telehealth (HOSPITAL_COMMUNITY): Payer: Self-pay | Admitting: Cardiology

## 2017-02-03 NOTE — Telephone Encounter (Signed)
Victorino DikeJennifer with [facility] Called to report the blister on BLE have resolved Request verbal to discontinue wound care and have patient resume ted hose/ compression stocking VERBAL ORDER GIVE   FYI Also called to inform that patient PCP has retired and patient is unsure who he will establish with, in the meantime will get verbal order from PCP office to transfer protime management to another physician

## 2017-02-10 ENCOUNTER — Encounter (HOSPITAL_COMMUNITY): Payer: Self-pay

## 2017-02-10 NOTE — Progress Notes (Signed)
Medical Record request completed, discharge summary 01/21/17 faxed to [facility] Attn: Junie BameMonica Martin to provided fax # [phone] (8 pages total). Copy of request scanned into patient's electronic medical record.  Ave FilterBradley, Megan Genevea, RN

## 2017-03-11 ENCOUNTER — Ambulatory Visit (HOSPITAL_COMMUNITY)
Admission: RE | Admit: 2017-03-11 | Discharge: 2017-03-11 | Disposition: A | Discharge status: Home / Self Care | Payer: Medicare Other | Source: Ambulatory Visit | Attending: Internal Medicine | Admitting: Internal Medicine

## 2017-03-11 VITALS — BP 122/78 | HR 80 | Wt 246.1 lb

## 2017-03-11 DIAGNOSIS — Z79899 Other long term (current) drug therapy: Secondary | ICD-10-CM | POA: Insufficient documentation

## 2017-03-11 DIAGNOSIS — Z7982 Long term (current) use of aspirin: Secondary | ICD-10-CM | POA: Diagnosis not present

## 2017-03-11 DIAGNOSIS — I482 Chronic atrial fibrillation, unspecified: Secondary | ICD-10-CM

## 2017-03-11 DIAGNOSIS — Z6841 Body Mass Index (BMI) 40.0 and over, adult: Secondary | ICD-10-CM | POA: Diagnosis not present

## 2017-03-11 DIAGNOSIS — Z7901 Long term (current) use of anticoagulants: Secondary | ICD-10-CM | POA: Diagnosis not present

## 2017-03-11 DIAGNOSIS — N184 Chronic kidney disease, stage 4 (severe): Secondary | ICD-10-CM

## 2017-03-11 DIAGNOSIS — I5022 Chronic systolic (congestive) heart failure: Secondary | ICD-10-CM | POA: Diagnosis not present

## 2017-03-11 DIAGNOSIS — E1122 Type 2 diabetes mellitus with diabetic chronic kidney disease: Secondary | ICD-10-CM | POA: Insufficient documentation

## 2017-03-11 DIAGNOSIS — I4510 Unspecified right bundle-branch block: Secondary | ICD-10-CM | POA: Diagnosis not present

## 2017-03-11 DIAGNOSIS — N183 Chronic kidney disease, stage 3 (moderate): Secondary | ICD-10-CM | POA: Diagnosis not present

## 2017-03-11 DIAGNOSIS — M109 Gout, unspecified: Secondary | ICD-10-CM | POA: Diagnosis not present

## 2017-03-11 DIAGNOSIS — J9610 Chronic respiratory failure, unspecified whether with hypoxia or hypercapnia: Secondary | ICD-10-CM | POA: Insufficient documentation

## 2017-03-11 DIAGNOSIS — E785 Hyperlipidemia, unspecified: Secondary | ICD-10-CM | POA: Insufficient documentation

## 2017-03-11 DIAGNOSIS — I130 Hypertensive heart and chronic kidney disease with heart failure and stage 1 through stage 4 chronic kidney disease, or unspecified chronic kidney disease: Secondary | ICD-10-CM | POA: Diagnosis present

## 2017-03-11 DIAGNOSIS — J449 Chronic obstructive pulmonary disease, unspecified: Secondary | ICD-10-CM | POA: Diagnosis not present

## 2017-03-11 DIAGNOSIS — G4733 Obstructive sleep apnea (adult) (pediatric): Secondary | ICD-10-CM | POA: Diagnosis not present

## 2017-03-11 DIAGNOSIS — I255 Ischemic cardiomyopathy: Secondary | ICD-10-CM | POA: Insufficient documentation

## 2017-03-11 DIAGNOSIS — Z794 Long term (current) use of insulin: Secondary | ICD-10-CM | POA: Diagnosis not present

## 2017-03-11 DIAGNOSIS — Z9981 Dependence on supplemental oxygen: Secondary | ICD-10-CM | POA: Insufficient documentation

## 2017-03-11 DIAGNOSIS — I2510 Atherosclerotic heart disease of native coronary artery without angina pectoris: Secondary | ICD-10-CM | POA: Diagnosis not present

## 2017-03-11 MED ORDER — SACUBITRIL-VALSARTAN 49-51 MG PO TABS: 1.0000 | ORAL_TABLET | Freq: Two times a day (BID) | ORAL | 3 refills | Status: DC

## 2017-03-11 NOTE — Patient Instructions (Signed)
Stop Potassium  Stop Aspirin  Increase Entresto to 49/51 mg Twice daily   Labs in 1 week  Your physician recommends that you schedule a follow-up appointment in: 2 month

## 2017-03-11 NOTE — Progress Notes (Signed)
ADVANCED HF CLINIC NEW PATIENT CONSULT    Patient ID: Donald Dunlap, male   DOB: 1950/12/05, 66 y.o.   MRN: [id number] PCP: Madelyn Flavors Primary Cardiologist: Dr Ladean Raya [facility]) Referring: Graciela Husbands (EP)  HPI:  Mr Binstock is a 66 y/o male with a history of CAD with CABG in 2006 at [facility] complicated by sternal dehiscence,chronic atrial fib on warfarin h/o multiple strokes, COPD on home O2, DM2, obesity, OSA, RBBB and chronic systolic HF. Refereed to HF Clinic by Dr. Graciela Husbands.  Admitted at [facility] in early 2018. He apparently has an ejection fraction between 20-30%. He has renal insufficiency with a creatinine of 1.5-  2.0.  Admitted 01/07/17-01/21/17 with volume overload and low output. RHC on 01/15/17 with Fick cardiac output/index 4.3/1.9. Echo with EF 30-35%, LA severely dilated.  Co ox 49%. Milrinone started to facilitate diuresis. Eventually weaned off. He was started on Entresto. Discharge weight 243 pounds.   He returns today for follow-up. At last visit here BP too low to titrate HF meds. Feels good. Weight stable. Trying to limit how much fluid he is taking. Getting around [facility]. Doesn't get out much. No edema, orthopnea or PND. Denies chest pain, palpitations. Sleeping in the recliner. Takes metolazone 1-2x/month. Labs from 03/08/17 K 5.6 creatinine 1.97 (previous 1.92)  Past Medical History:  Diagnosis Date  . Atrial fibrillation, permanent (HCC)   . Blood in stool   . Chronic systolic congestive heart failure, NYHA class 3 (HCC)   . Diabetes (HCC)   . Edema   . Esophageal reflux   . Gout   . HLD (hyperlipidemia)   . HTN (hypertension)   . Iron deficiency anemia 10/02/2016  . Ischemic cardiomyopathy   . Migraine aura, persistent   . Morbid obesity with BMI of 40.0-44.9, adult (HCC)   . OSA (obstructive sleep apnea)   . Peptic ulcer   . RBBB LAFB   . Venous insufficiency      SH:  Social History   Social History  . Marital status: Married    Spouse name: N/A  . Number  of children: N/A  . Years of education: N/A   Occupational History  . Not on file.   Social History Main Topics  . Smoking status: Never Smoker  . Smokeless tobacco: Never Used  . Alcohol use No  . Drug use: No  . Sexual activity: Not on file   Other Topics Concern  . Not on file   Social History Narrative  . No narrative on file    FH: No h/o familial cardiomyopathy or connective tissue disease   Current Outpatient Prescriptions  Medication Sig Dispense Refill  . acetaminophen (TYLENOL) 500 MG tablet Take 500 mg by mouth every 6 (six) hours as needed.    Marland Kitchen allopurinol (ZYLOPRIM) 100 MG tablet Take 100 mg by mouth daily.    Marland Kitchen aspirin 81 MG EC tablet Take 81 mg by mouth daily. Swallow whole.    Marland Kitchen atorvastatin (LIPITOR) 20 MG tablet Take 20 mg by mouth daily.    . calcitRIOL (ROCALTROL) 0.25 MCG capsule Take 0.25 mcg by mouth daily.     . carvedilol (COREG) 3.125 MG tablet Take 1 tablet (3.125 mg total) by mouth 2 (two) times daily with a meal. 60 tablet 3  . dimenhyDRINATE (DRAMAMINE) 50 MG tablet Take 50-100 mg by mouth every 8 (eight) hours as needed for nausea.    . diphenhydrAMINE (BENADRYL) 25 MG tablet Take 25 mg by mouth every 6 (six)  hours as needed.    . furosemide (LASIX) 80 MG tablet Take 1 tablet (80 mg total) by mouth 2 (two) times daily. 60 tablet 3  . glipiZIDE (GLUCOTROL) 5 MG tablet Take 5 mg by mouth 2 (two) times daily before a meal.     . insulin glargine (LANTUS) 100 UNIT/ML injection Inject 25 Units into the skin daily.     . insulin lispro (HUMALOG) 100 UNIT/ML injection Inject 15 Units into the skin 3 (three) times daily before meals.    . metolazone (ZAROXOLYN) 2.5 MG tablet Take one tablet as needed for weight gain greater than 3 pounds in one day or 5 pounds in one week. 30 tablet 2  . OXYGEN Inhale into the lungs.    . pantoprazole (PROTONIX) 40 MG tablet Take 40 mg by mouth daily.     . potassium chloride SA (K-DUR,KLOR-CON) 20 MEQ tablet Take 2  tablets (40 mEq total) by mouth daily. 60 tablet 3  . sacubitril-valsartan (ENTRESTO) 24-26 MG Take 1 tablet by mouth 2 (two) times daily. 60 tablet 3  . spironolactone (ALDACTONE) 25 MG tablet Take 1 tablet (25 mg total) by mouth daily. 30 tablet 3  . warfarin (COUMADIN) 4 MG tablet Take 4 mg by mouth daily. 1/2 tab Monday and Friday and  One whole tab other days     No current facility-administered medications for this encounter.     Vitals:   03/11/17 1122  BP: 122/78  Pulse: 80  SpO2: 98%  Weight: 246 lb 1.9 oz (111.6 kg)    PHYSICAL EXAM: General:  Walks with can. NAD HEENT: normal Neck: supple. JVP flat.  Carotids 2+ bilat; no bruits. No lymphadenopathy or thryomegaly appreciated. Cor: PMI nondisplaced. Irregular rate & rhythm. No rubs, gallops or murmurs. Lungs: clear with decreased breath sounds throughout Abdomen: obese soft, nontender, nondistended. No hepatosplenomegaly. No bruits or masses. Good bowel sounds. Extremities: no cyanosis, clubbing, rash, trace edema Neuro: alert & orientedx3, cranial nerves grossly intact. moves all 4 extremities w/o difficulty. Affect pleasant  ASSESSMENT & PLAN: 1. Chronic Systolic HF: ICM, Echo 12/2016 with EF 30-35%.   - Improved NYHA II. Doing much better - Volume status stable today, continue 80mg  Lasix BID.  - Continue metolazone 2.5mg  as needed  - Continue Spiro 25mg  daily.  - Continue Coreg 3.125mg  BID - Increase Entresto 49/51 mg BID.    - Reinforced diet restrictions, and 2L fluid restriction. - BNP today.   2. ICM: with history of CAD in 2006 - Denies angina.  - Can stop ASA with warfarin  3. Chronic A fib  -CHADSVasc - 6 - continue warfarin.  4. DM: Per PCP 5. CKD 3: Baseline creatinine seems to be 1.6-1.9  -stable on recent check. Recheck in 1 week after Entresto increased. If bumps will decrease lasix to 80/40 6. Gout - stable 7. Chronic respiratory failure - Stable.    Arvilla MeresBensimhon, Oletha Tolson MD 11:30  AM

## 2017-03-11 NOTE — Progress Notes (Signed)
Advanced Heart Failure Medication Review by a Pharmacist  Does the patient  feel that his/her medications are working for him/her?  yes  Has the patient been experiencing any side effects to the medications prescribed?  no  Does the patient measure his/her own blood pressure or blood glucose at home?  yes   Does the patient have any problems obtaining medications due to transportation or finances?   no  Understanding of regimen: good Understanding of indications: good Potential of compliance: good Patient understands to avoid NSAIDs. Patient understands to avoid decongestants.  Issues to address at subsequent visits: none.    Pharmacist comments: Donald Dunlap is a 66 y/o male who presents with his daughter and a medication list that included his daily weight measurements. He reports good adherence to his medication regimen and had no medication-related questions or concerns for me at this time.     Time with patient: 10 minutes Preparation and documentation time: 5 minutes Total time: 15 minutes

## 2017-03-11 NOTE — Addendum Note (Signed)
Encounter addended by: Noralee SpaceSchub, Kendrix Orman M, RN on: 03/11/2017 11:55 AM<BR>    Actions taken: Medication long-term status modified, Sign clinical note, Order list changed

## 2017-03-17 ENCOUNTER — Telehealth (HOSPITAL_COMMUNITY): Payer: Self-pay | Admitting: Vascular Surgery

## 2017-03-17 NOTE — Telephone Encounter (Signed)
Pt wife called to give fax number to primary care doctor [phone]

## 2017-03-18 ENCOUNTER — Telehealth (HOSPITAL_COMMUNITY): Payer: Self-pay | Admitting: Pharmacist

## 2017-03-18 NOTE — Telephone Encounter (Signed)
Last OV note faxed to pcp and Dr Delma PostSrinvison [place] at [phone].  Pt's wife aware this has been done.  She also reports pt's Sherryll Burgerntresto is very expensive and they can not afford.  Will give info to pharmacist and have them f/u w/pt.

## 2017-03-18 NOTE — Telephone Encounter (Signed)
Patient enrolled in [employer] - first grant of $800 for heart failure medications.  Member ID: [id number] Group ID: [id number] RxBin ID: [id number] PCN: [id number] Eligibility Start Date: 12/18/2016 Eligibility End Date: 03/17/2018 Assistance Amount: $800.00

## 2017-04-06 ENCOUNTER — Other Ambulatory Visit (HOSPITAL_COMMUNITY): Payer: Self-pay | Admitting: Internal Medicine

## 2017-05-18 ENCOUNTER — Ambulatory Visit (HOSPITAL_COMMUNITY)
Admission: RE | Admit: 2017-05-18 | Discharge: 2017-05-18 | Disposition: A | Discharge status: Home / Self Care | Payer: Medicare Other | Source: Ambulatory Visit | Attending: Internal Medicine | Admitting: Internal Medicine

## 2017-05-18 ENCOUNTER — Encounter (HOSPITAL_COMMUNITY): Payer: Self-pay | Admitting: Internal Medicine

## 2017-05-18 VITALS — BP 117/79 | HR 80 | Wt 251.8 lb

## 2017-05-18 DIAGNOSIS — I255 Ischemic cardiomyopathy: Secondary | ICD-10-CM | POA: Diagnosis not present

## 2017-05-18 DIAGNOSIS — J449 Chronic obstructive pulmonary disease, unspecified: Secondary | ICD-10-CM | POA: Insufficient documentation

## 2017-05-18 DIAGNOSIS — Z79899 Other long term (current) drug therapy: Secondary | ICD-10-CM | POA: Diagnosis not present

## 2017-05-18 DIAGNOSIS — Z9981 Dependence on supplemental oxygen: Secondary | ICD-10-CM | POA: Diagnosis not present

## 2017-05-18 DIAGNOSIS — Z6841 Body Mass Index (BMI) 40.0 and over, adult: Secondary | ICD-10-CM | POA: Diagnosis not present

## 2017-05-18 DIAGNOSIS — M109 Gout, unspecified: Secondary | ICD-10-CM | POA: Diagnosis not present

## 2017-05-18 DIAGNOSIS — J9610 Chronic respiratory failure, unspecified whether with hypoxia or hypercapnia: Secondary | ICD-10-CM | POA: Insufficient documentation

## 2017-05-18 DIAGNOSIS — I130 Hypertensive heart and chronic kidney disease with heart failure and stage 1 through stage 4 chronic kidney disease, or unspecified chronic kidney disease: Secondary | ICD-10-CM | POA: Insufficient documentation

## 2017-05-18 DIAGNOSIS — I872 Venous insufficiency (chronic) (peripheral): Secondary | ICD-10-CM | POA: Diagnosis not present

## 2017-05-18 DIAGNOSIS — N183 Chronic kidney disease, stage 3 (moderate): Secondary | ICD-10-CM | POA: Insufficient documentation

## 2017-05-18 DIAGNOSIS — I482 Chronic atrial fibrillation, unspecified: Secondary | ICD-10-CM

## 2017-05-18 DIAGNOSIS — I2510 Atherosclerotic heart disease of native coronary artery without angina pectoris: Secondary | ICD-10-CM

## 2017-05-18 DIAGNOSIS — Z7901 Long term (current) use of anticoagulants: Secondary | ICD-10-CM | POA: Diagnosis not present

## 2017-05-18 DIAGNOSIS — G4733 Obstructive sleep apnea (adult) (pediatric): Secondary | ICD-10-CM | POA: Diagnosis not present

## 2017-05-18 DIAGNOSIS — I4510 Unspecified right bundle-branch block: Secondary | ICD-10-CM | POA: Insufficient documentation

## 2017-05-18 DIAGNOSIS — Z794 Long term (current) use of insulin: Secondary | ICD-10-CM | POA: Diagnosis not present

## 2017-05-18 DIAGNOSIS — Z8711 Personal history of peptic ulcer disease: Secondary | ICD-10-CM | POA: Insufficient documentation

## 2017-05-18 DIAGNOSIS — E785 Hyperlipidemia, unspecified: Secondary | ICD-10-CM | POA: Insufficient documentation

## 2017-05-18 DIAGNOSIS — Z8673 Personal history of transient ischemic attack (TIA), and cerebral infarction without residual deficits: Secondary | ICD-10-CM | POA: Insufficient documentation

## 2017-05-18 DIAGNOSIS — I5022 Chronic systolic (congestive) heart failure: Secondary | ICD-10-CM | POA: Diagnosis not present

## 2017-05-18 DIAGNOSIS — E1122 Type 2 diabetes mellitus with diabetic chronic kidney disease: Secondary | ICD-10-CM | POA: Diagnosis not present

## 2017-05-18 DIAGNOSIS — Z951 Presence of aortocoronary bypass graft: Secondary | ICD-10-CM | POA: Insufficient documentation

## 2017-05-18 LAB — BASIC METABOLIC PANEL
Anion gap: 8 (ref 5–15)
BUN: 26 mg/dL — AB (ref 6–20)
CHLORIDE: 98 mmol/L — AB (ref 101–111)
CO2: 29 mmol/L (ref 22–32)
CREATININE: 1.23 mg/dL (ref 0.61–1.24)
Calcium: 9.7 mg/dL (ref 8.9–10.3)
GFR calc Af Amer: >60 mL/min (ref >60)
GFR calc non Af Amer: 59 mL/min — ABNORMAL LOW (ref >60)
GLUCOSE: 106 mg/dL — AB (ref 65–99)
POTASSIUM: 3.9 mmol/L (ref 3.5–5.1)
Sodium: 135 mmol/L (ref 135–145)

## 2017-05-18 NOTE — Patient Instructions (Signed)
Continue Furosemide 80 mg daily  Labs today  Your physician recommends that you schedule a follow-up appointment in: 6 weeks

## 2017-05-18 NOTE — Progress Notes (Signed)
ADVANCED HF CLINIC NEW PATIENT CONSULT    Patient ID: Donald Dunlap, male   DOB: 04-Feb-1951, 66 y.o.   MRN: [id number] PCP: Donald Dunlap Primary Cardiologist: Dr Donald Dunlap) Referring: Donald Dunlap (EP)  HPI: Donald Dunlap is a 66 y/o male with a history of CAD with CABG in 2006 at [facility] complicated by sternal dehiscence,chronic atrial fib on warfarin h/o multiple strokes, COPD on home O2, DM2, obesity, OSA, RBBB and chronic systolic HF. Refereed to HF Clinic by Dr. Graciela Dunlap.  Admitted at [facility] in early 2018. He apparently has an ejection fraction between 20-30%. He has renal insufficiency with a creatinine of 1.5-  2.0.  Admitted 01/07/17-01/21/17 with volume overload and low output. RHC on 01/15/17 with Fick cardiac output/index 4.3/1.9. Echo with EF 30-35%, LA severely dilated.  Co ox 49%. Milrinone started to facilitate diuresis. Eventually weaned off. He was started on Entresto. Discharge weight 243 pounds.   He returns for HF follow up. Last visit entresto was increase to 49-51 mg twice a day. Overall feeling much better. Denies PND/Orthopnea. SOB after walking 40-50 feet. Weight at home 245-253 pounds. Drinking > 2 liters Taking all medications.     Past Medical History:  Diagnosis Date  . Atrial fibrillation, permanent (HCC)   . Blood in stool   . Chronic systolic congestive heart failure, NYHA class 3 (HCC)   . Diabetes (HCC)   . Edema   . Esophageal reflux   . Gout   . HLD (hyperlipidemia)   . HTN (hypertension)   . Iron deficiency anemia 10/02/2016  . Ischemic cardiomyopathy   . Migraine aura, persistent   . Morbid obesity with BMI of 40.0-44.9, adult (HCC)   . OSA (obstructive sleep apnea)   . Peptic ulcer   . RBBB LAFB   . Venous insufficiency      SH:  Social History   Social History  . Marital status: Married    Spouse name: N/A  . Number of children: N/A  . Years of education: N/A   Occupational History  . Not on file.   Social History Main Topics  .  Smoking status: Never Smoker  . Smokeless tobacco: Never Used  . Alcohol use No  . Drug use: No  . Sexual activity: Not on file   Other Topics Concern  . Not on file   Social History Narrative  . No narrative on file    FH: No h/o familial cardiomyopathy or connective tissue disease   Current Outpatient Prescriptions  Medication Sig Dispense Refill  . acetaminophen (TYLENOL) 500 MG tablet Take 500 mg by mouth every 6 (six) hours as needed.    Marland Kitchen allopurinol (ZYLOPRIM) 100 MG tablet Take 100 mg by mouth daily.    Marland Kitchen atorvastatin (LIPITOR) 20 MG tablet Take 20 mg by mouth daily.    . calcitRIOL (ROCALTROL) 0.25 MCG capsule Take 0.25 mcg by mouth daily.     . carvedilol (COREG) 6.25 MG tablet Take 6.25 mg by mouth 2 (two) times daily with a meal.    . dimenhyDRINATE (DRAMAMINE) 50 MG tablet Take 50-100 mg by mouth every 8 (eight) hours as needed for nausea.    . furosemide (LASIX) 80 MG tablet Take 80 mg by mouth daily.    Marland Kitchen glipiZIDE (GLUCOTROL) 5 MG tablet Take 5 mg by mouth 2 (two) times daily before a meal.     . insulin glargine (LANTUS) 100 UNIT/ML injection Inject 25 Units into the skin daily.     Marland Kitchen  insulin lispro (HUMALOG) 100 UNIT/ML injection Inject 15 Units into the skin 3 (three) times daily before meals.    . metolazone (ZAROXOLYN) 2.5 MG tablet Take one tablet as needed for weight gain greater than 3 pounds in one day or 5 pounds in one week. 30 tablet 2  . OXYGEN Inhale into the lungs.    . pantoprazole (PROTONIX) 40 MG tablet Take 40 mg by mouth daily.     . sacubitril-valsartan (ENTRESTO) 49-51 MG Take 1 tablet by mouth 2 (two) times daily. 60 tablet 3  . warfarin (COUMADIN) 4 MG tablet Take 4 mg by mouth daily. 1/2 tab Monday and Friday and  One whole tab other days     No current facility-administered medications for this encounter.     Vitals:   05/18/17 1144  BP: 117/79  Pulse: 80  SpO2: 95%  Weight: 251 lb 12.8 oz (114.2 kg)    PHYSICAL EXAM: General:   Well appearing. No resp difficulty. Arrived in wheel chair . Daughter present HEENT: normal Neck: supple. no JVD. Carotids 2+ bilat; no bruits. No lymphadenopathy or thryomegaly appreciated. Cor: PMI nondisplaced. Regular rate & rhythm. No rubs, gallops or murmurs. Lungs: clear Abdomen: soft, nontender, nondistended. No hepatosplenomegaly. No bruits or masses. Good bowel sounds. Extremities: no cyanosis, clubbing, rash, R and LLE dressings.  Neuro: alert & orientedx3, cranial nerves grossly intact. moves all 4 extremities w/o difficulty. Affect pleasant   ASSESSMENT & PLAN: 1. Chronic Systolic HF: ICM, Echo 12/2016 with EF 30-35%.   - NYHA II-III. Volume status stable. Continue lasix 80 mg daily - Continue metolazone 2.5mg  as needed  - Continue Spiro 25mg  daily.  -Coreg recently increase by Cardiologist in his home town. Continue Coreg 6.25 mg twice a day.  -Continue  Entresto 49/51 mg BID.    - BMET today.  2. ICM: with history of CAD in 2006 - No S/S ischemia - Can stop ASA with warfarin  3. Chronic A fib  -CHADSVasc - 6 Rate controlled.  - continue warfarin.  4. DM: Per PCP 5. CKD 3: Baseline creatinine seems to be 1.6-1.9 - BMET today.  6. Gout - stable 7. Chronic respiratory failure - Stable.   Follow up in 6-8 week.s    Donald Clegg NP-C  12:04 PM  Patient seen and examined with Donald Becket, NP. We discussed all aspects of the encounter. I agree with the assessment and plan as stated above.   He is doing very well. More functional. Volume status looks good. AF rate controlled. CAD stable without ischemia. Will continue current regimen. If remains stable and EF remains < 35% will need to consider ICD.   Donald Meres, MD  12:02 AM

## 2017-06-09 ENCOUNTER — Other Ambulatory Visit: Payer: Self-pay | Admitting: Internal Medicine

## 2017-07-06 ENCOUNTER — Encounter (HOSPITAL_COMMUNITY): Payer: Commercial Managed Care - PPO

## 2017-07-08 ENCOUNTER — Encounter (HOSPITAL_COMMUNITY): Payer: Self-pay

## 2017-07-08 ENCOUNTER — Ambulatory Visit (HOSPITAL_COMMUNITY)
Admission: RE | Admit: 2017-07-08 | Discharge: 2017-07-08 | Disposition: A | Discharge status: Home / Self Care | Payer: Medicare Other | Source: Ambulatory Visit | Attending: Cardiology | Admitting: Cardiology

## 2017-07-08 VITALS — BP 115/82 | HR 85 | Wt 246.5 lb

## 2017-07-08 DIAGNOSIS — N183 Chronic kidney disease, stage 3 unspecified: Secondary | ICD-10-CM

## 2017-07-08 DIAGNOSIS — J9610 Chronic respiratory failure, unspecified whether with hypoxia or hypercapnia: Secondary | ICD-10-CM | POA: Insufficient documentation

## 2017-07-08 DIAGNOSIS — E1122 Type 2 diabetes mellitus with diabetic chronic kidney disease: Secondary | ICD-10-CM | POA: Insufficient documentation

## 2017-07-08 DIAGNOSIS — I2510 Atherosclerotic heart disease of native coronary artery without angina pectoris: Secondary | ICD-10-CM

## 2017-07-08 DIAGNOSIS — I5022 Chronic systolic (congestive) heart failure: Secondary | ICD-10-CM | POA: Insufficient documentation

## 2017-07-08 DIAGNOSIS — Z79899 Other long term (current) drug therapy: Secondary | ICD-10-CM | POA: Insufficient documentation

## 2017-07-08 DIAGNOSIS — J9611 Chronic respiratory failure with hypoxia: Secondary | ICD-10-CM | POA: Diagnosis not present

## 2017-07-08 DIAGNOSIS — Z794 Long term (current) use of insulin: Secondary | ICD-10-CM | POA: Insufficient documentation

## 2017-07-08 DIAGNOSIS — Z7901 Long term (current) use of anticoagulants: Secondary | ICD-10-CM | POA: Insufficient documentation

## 2017-07-08 DIAGNOSIS — M109 Gout, unspecified: Secondary | ICD-10-CM | POA: Diagnosis not present

## 2017-07-08 DIAGNOSIS — I130 Hypertensive heart and chronic kidney disease with heart failure and stage 1 through stage 4 chronic kidney disease, or unspecified chronic kidney disease: Secondary | ICD-10-CM | POA: Diagnosis not present

## 2017-07-08 DIAGNOSIS — I482 Chronic atrial fibrillation, unspecified: Secondary | ICD-10-CM

## 2017-07-08 MED ORDER — CARVEDILOL 3.125 MG PO TABS: 9.3750 mg | ORAL_TABLET | Freq: Two times a day (BID) | ORAL | 6 refills | Status: DC

## 2017-07-08 NOTE — Patient Instructions (Addendum)
INCREASE Carvedilol (Coreg) to 9.375 mg (3 tabs) twice daily.  Follow up 2 months with Dr. Gala RomneyBensimhon.  _________________________________________________________  [place] Ridge[place]age Code: 8002   Take all medication as prescribed the day of your appointment. Bring all medications with you to your appointment.  Do the following things EVERYDAY: 1) Weigh yourself in the morning before breakfast. Write it down and keep it in a log. 2) Take your medicines as prescribed 3) Eat low salt foods-Limit salt (sodium) to 2000 mg per day.  4) Stay as active as you can everyday 5) Limit all fluids for the day to less than 2 liters

## 2017-07-08 NOTE — Progress Notes (Signed)
ADVANCED HF CLINIC NEW PATIENT CONSULT    Patient ID: Donald Dunlap, male   DOB: 11/03/1950, 66 y.o.   MRN: [id number] PCP: Madelyn FlavorsSydney harris Primary Cardiologist: Dr Ladean RayaSrinvisan [facility]([place]) Referring: Graciela HusbandsKlein (EP) HF: Dr. Gala RomneyBensimhon   HPI: Donald Dunlap is a 66 y.o. male with a [facility] of [facility] complicated by sternal dehiscence,chronic atrial fib on warfarin h/o multiple strokes, COPD on home O2, DM2, obesity, OSA, RBBB and chronic systolic HF. Refereed to HF Clinic by Dr. Graciela HusbandsKlein.  [facility]. He apparently has an ejection fraction between 20-30%. He has renal insufficiency with a creatinine of 1.5-  2.0.  Admitted 01/07/17-01/21/17 with volume overload and low output. RHC on 01/15/17 with Fick cardiac output/index 4.3/1.9. Echo with EF 30-35%, LA severely dilated.  Co ox 49%. Milrinone started to facilitate diuresis. Eventually weaned off. He was started on Entresto. Discharge weight 243 pounds.   He returns for HF follow up. Last visit entresto was increase to 49-51 mg twice a day. Overall feeling much better. Denies PND/Orthopnea. SOB after walking 40-50 feet. Weight at home 245-253 pounds. Drinking > 2 liters Taking all medications.   Labs PCP 07/06/17 K 4.6, Cr 1.21  Review of systems complete and found to be negative unless listed in HPI.    Past Medical History:  Diagnosis Date  . Atrial fibrillation, permanent (HCC)   . Blood in stool   . Chronic systolic congestive heart failure, NYHA class 3 (HCC)   . Diabetes (HCC)   . Edema   . Esophageal reflux   . Gout   . HLD (hyperlipidemia)   . HTN (hypertension)   . Iron deficiency anemia 10/02/2016  . Ischemic cardiomyopathy   . Migraine aura, persistent   . Morbid obesity with BMI of 40.0-44.9, adult (HCC)   . OSA (obstructive sleep apnea)   . Peptic ulcer   . RBBB LAFB   . Venous insufficiency    SH:  Social History   Social History  . Marital status: Married    Spouse name: N/A    . Number of children: N/A  . Years of education: N/A   Occupational History  . Not on file.   Social History Main Topics  . Smoking status: Never Smoker  . Smokeless tobacco: Never Used  . Alcohol use No  . Drug use: No  . Sexual activity: Not on file   Other Topics Concern  . Not on file   Social History Narrative  . No narrative on file   FH: No h/o familial cardiomyopathy or connective tissue disease  Current Outpatient Prescriptions  Medication Sig Dispense Refill  . acetaminophen (TYLENOL) 500 MG tablet Take 500 mg by mouth every 6 (six) hours as needed.    Marland Kitchen. allopurinol (ZYLOPRIM) 100 MG tablet Take 100 mg by mouth daily.    Marland Kitchen. atorvastatin (LIPITOR) 20 MG tablet Take 20 mg by mouth daily.    . calcitRIOL (ROCALTROL) 0.25 MCG capsule Take 0.25 mcg by mouth daily.     . carvedilol (COREG) 3.125 MG tablet Take 3.125 mg by mouth 2 (two) times daily with a meal.    . dimenhyDRINATE (DRAMAMINE) 50 MG tablet Take 50-100 mg by mouth every 8 (eight) hours as needed for nausea.    . furosemide (LASIX) 80 MG tablet Take 80 mg by mouth daily.    Marland Kitchen. glipiZIDE (GLUCOTROL) 5 MG tablet Take 5 mg by mouth 2 (two) times daily before  a meal.     . insulin glargine (LANTUS) 100 UNIT/ML injection Inject 25 Units into the skin daily.     . insulin lispro (HUMALOG) 100 UNIT/ML injection Inject 15 Units into the skin 3 (three) times daily before meals.    . metolazone (ZAROXOLYN) 2.5 MG tablet Take one tablet as needed for weight gain greater than 3 pounds in one day or 5 pounds in one week. 30 tablet 2  . OXYGEN Inhale into the lungs.    . pantoprazole (PROTONIX) 40 MG tablet Take 40 mg by mouth daily.     . sacubitril-valsartan (ENTRESTO) 49-51 MG Take 1 tablet by mouth 2 (two) times daily. 60 tablet 3  . spironolactone (ALDACTONE) 25 MG tablet Take 25 mg by mouth daily.    Marland Kitchen warfarin (COUMADIN) 4 MG tablet Take 4 mg by mouth daily. 1/2 tab Monday and Friday and  One whole tab other days      No current facility-administered medications for this encounter.    Vitals:   07/08/17 1055  BP: 115/82  Pulse: 85  SpO2: 96%  Weight: 246 lb 8 oz (111.8 kg)   Wt Readings from Last 3 Encounters:  07/08/17 246 lb 8 oz (111.8 kg)  05/18/17 251 lb 12.8 oz (114.2 kg)  03/11/17 246 lb 1.9 oz (111.6 kg)    PHYSICAL EXAM: General: Elderly appearing. No resp difficulty. HEENT: Normal Neck: Supple. JVP 6-7 cm. Carotids 2+ bilat; no bruits. No thyromegaly or nodule noted. Cor: PMI nondisplaced. Irregularly irregular, No M/G/R noted Lungs: CTAB, normal effort. Abdomen: Soft, non-tender, non-distended, no HSM. No bruits or masses. +BS  Extremities: No cyanosis, clubbing, or rash. R and LLE no edema.  Neuro: Alert & orientedx3, cranial nerves grossly intact. moves all 4 extremities w/o difficulty. Affect pleasant   ASSESSMENT & PLAN: 1. Chronic Systolic HF: ICM, Echo 12/2016 with EF 30-35%.  S/p Medtronic ICD 10/14/2016. - NYHA II-III.  - Volume status stable on exam.  - Continue lasix 80 mg daily.  - Continue metolazone 2.5mg  as needed.  - Continue Spiro 25mg  daily.  - Increase to coreg 9.375 mg BID.  - Continue Entresto 49/51 mg BID.    - BMET today.  2. ICM: with history of CAD in 2006 - No s/s of ischemia.    - Can stop ASA with warfarin  3. Chronic A fib  -CHADSVasc - 6 Rate controlled.  - continue warfarin. Denies bleeding.  4. DM: - Per PCP 5. CKD 3:  - Creatinine stable on recent lab ( Cr 1.21 07/06/17)  6. Gout - Stable.  7. Chronic respiratory failure - Stable.   Doing well overall. He had an ICD placed earlier this year, so no need for Echo/ICD consideration at this time.  Labs stable earlier this week as above.  RTC 2-3 months. Sooner with symptoms.   Graciella Freer, PA-C  11:13 AM

## 2017-08-06 ENCOUNTER — Telehealth: Payer: Self-pay | Admitting: Physician Assistant

## 2017-08-06 NOTE — Telephone Encounter (Signed)
Pt wife called stating pt was taking 80 mg of lasix daily and zaroxolyn 2.5 mg daily and has been gaining weight and short of breath over the past week. I instructed him to take an extra 80 of lasix daily and continue the zaroxolyn. I also instructed them to call on Monday for an office appt. If his breathing does not improve, I told them to report to the ER. She expressed understanding of the plan.

## 2017-08-31 ENCOUNTER — Encounter (HOSPITAL_COMMUNITY): Payer: Commercial Managed Care - PPO | Admitting: Internal Medicine

## 2017-09-01 ENCOUNTER — Other Ambulatory Visit: Payer: Self-pay

## 2017-09-01 ENCOUNTER — Ambulatory Visit (HOSPITAL_COMMUNITY)
Admission: RE | Admit: 2017-09-01 | Discharge: 2017-09-01 | Disposition: A | Discharge status: Home / Self Care | Payer: Medicare Other | Source: Ambulatory Visit | Attending: Internal Medicine | Admitting: Internal Medicine

## 2017-09-01 ENCOUNTER — Encounter (HOSPITAL_COMMUNITY): Payer: Self-pay | Admitting: Internal Medicine

## 2017-09-01 VITALS — BP 116/76 | HR 87 | Wt 254.5 lb

## 2017-09-01 DIAGNOSIS — Z794 Long term (current) use of insulin: Secondary | ICD-10-CM | POA: Diagnosis not present

## 2017-09-01 DIAGNOSIS — Z95810 Presence of automatic (implantable) cardiac defibrillator: Secondary | ICD-10-CM | POA: Diagnosis not present

## 2017-09-01 DIAGNOSIS — I5022 Chronic systolic (congestive) heart failure: Secondary | ICD-10-CM | POA: Diagnosis not present

## 2017-09-01 DIAGNOSIS — Z7901 Long term (current) use of anticoagulants: Secondary | ICD-10-CM | POA: Diagnosis not present

## 2017-09-01 DIAGNOSIS — Z9981 Dependence on supplemental oxygen: Secondary | ICD-10-CM | POA: Insufficient documentation

## 2017-09-01 DIAGNOSIS — E785 Hyperlipidemia, unspecified: Secondary | ICD-10-CM | POA: Insufficient documentation

## 2017-09-01 DIAGNOSIS — J449 Chronic obstructive pulmonary disease, unspecified: Secondary | ICD-10-CM | POA: Insufficient documentation

## 2017-09-01 DIAGNOSIS — I130 Hypertensive heart and chronic kidney disease with heart failure and stage 1 through stage 4 chronic kidney disease, or unspecified chronic kidney disease: Secondary | ICD-10-CM | POA: Insufficient documentation

## 2017-09-01 DIAGNOSIS — J9610 Chronic respiratory failure, unspecified whether with hypoxia or hypercapnia: Secondary | ICD-10-CM | POA: Diagnosis not present

## 2017-09-01 DIAGNOSIS — M109 Gout, unspecified: Secondary | ICD-10-CM | POA: Insufficient documentation

## 2017-09-01 DIAGNOSIS — E1122 Type 2 diabetes mellitus with diabetic chronic kidney disease: Secondary | ICD-10-CM | POA: Insufficient documentation

## 2017-09-01 DIAGNOSIS — Z6841 Body Mass Index (BMI) 40.0 and over, adult: Secondary | ICD-10-CM | POA: Insufficient documentation

## 2017-09-01 DIAGNOSIS — G4733 Obstructive sleep apnea (adult) (pediatric): Secondary | ICD-10-CM | POA: Diagnosis not present

## 2017-09-01 DIAGNOSIS — Z79899 Other long term (current) drug therapy: Secondary | ICD-10-CM | POA: Diagnosis not present

## 2017-09-01 DIAGNOSIS — I482 Chronic atrial fibrillation, unspecified: Secondary | ICD-10-CM

## 2017-09-01 DIAGNOSIS — N183 Chronic kidney disease, stage 3 (moderate): Secondary | ICD-10-CM | POA: Diagnosis not present

## 2017-09-01 DIAGNOSIS — I4510 Unspecified right bundle-branch block: Secondary | ICD-10-CM | POA: Insufficient documentation

## 2017-09-01 DIAGNOSIS — I255 Ischemic cardiomyopathy: Secondary | ICD-10-CM | POA: Diagnosis not present

## 2017-09-01 DIAGNOSIS — I2510 Atherosclerotic heart disease of native coronary artery without angina pectoris: Secondary | ICD-10-CM | POA: Diagnosis not present

## 2017-09-01 NOTE — Patient Instructions (Signed)
INCREASE Carvedilol to 6.25mg  twice daily.  Follow up with Dr.Bensimhon in 3-4 months.

## 2017-09-01 NOTE — Progress Notes (Signed)
Advanced Heart Failure Clinic Note    Patient ID: Donald Dunlap, male   DOB: 1950/11/04, 66 y.o.   MRN: [id number]   PCP: Madelyn FlavorsSydney harris Primary Cardiologist: Dr Ladean RayaSrinvisan [facility]([place]) Referring: Graciela HusbandsKlein (EP) HF: Dr. Gala RomneyBensimhon   HPI:  Donald Dunlap is a 66 y.o. male with a [facility] of CAD with [facility] in [facility] at [facility] complicated by sternal dehiscence, chronic atrial fib on warfarin, h/o multiple strokes, COPD on home O2, CKD 3 with a creatinine of 1.5-  2.0. DM2, obesity, OSA, RBBB and chronic systolic HF. Refereed to HF Clinic by Dr. Graciela HusbandsKlein.   Admitted 01/07/17-01/21/17 with volume overload and low output. RHC on 01/15/17 with Fick cardiac output/index 4.3/1.9. Echo with EF 30-35%, LA severely dilated.  Co ox 49%. Milrinone started to facilitate diuresis. Eventually weaned off. He was started on Entresto. Discharge weight 243 pounds.   He presents today for regular follow up.  Last visit coreg increased to 9.375 from what we thought was 6.25 mg BID.   Pt states he was only taking 3.125 mg BID and felt much worse after the large increase. States he had BP in the 90s, lightheadedness, poor glucose control, and chest discomfort with SOB. He went back to 3.125 mg BID 08/20/17 and has felt better since.  Weight up 8 lbs from last visit. Taking lasix 80 mg daily. Denies PND/Orthopnea. Drinking > 2 L. Taking all medications.   ICD interrogated personally in clinc: Optivol below threshold. Thoracic impedence with mild downtrend but now heading back towards dry. Pt activity 1-2 hrs daily.   Labs PCP 07/06/17 K 4.6, Cr 1.21  Review of systems complete and found to be negative unless listed in HPI.    Past Medical History:  Diagnosis Date  . Atrial fibrillation, permanent (HCC)   . Blood in stool   . Chronic systolic congestive heart failure, NYHA class 3 (HCC)   . Diabetes (HCC)   . Edema   . Esophageal reflux   . Gout   . HLD (hyperlipidemia)   . HTN (hypertension)   . Iron deficiency anemia 10/02/2016    . Ischemic cardiomyopathy   . Migraine aura, persistent   . Morbid obesity with BMI of 40.0-44.9, adult (HCC)   . OSA (obstructive sleep apnea)   . Peptic ulcer   . RBBB LAFB   . Venous insufficiency    SH:  Social History   Socioeconomic History  . Marital status: Married    Spouse name: Not on file  . Number of children: Not on file  . Years of education: Not on file  . Highest education level: Not on file  Social Needs  . Financial resource strain: Not on file  . Food insecurity - worry: Not on file  . Food insecurity - inability: Not on file  . Transportation needs - medical: Not on file  . Transportation needs - non-medical: Not on file  Occupational History  . Not on file  Tobacco Use  . Smoking status: Never Smoker  . Smokeless tobacco: Never Used  Substance and Sexual Activity  . Alcohol use: No  . Drug use: No  . Sexual activity: Not on file  Other Topics Concern  . Not on file  Social History Narrative  . Not on file   FH: No h/o familial cardiomyopathy or connective tissue disease  Current Outpatient Medications  Medication Sig Dispense Refill  . acetaminophen (TYLENOL) 500 MG tablet Take 500 mg by mouth every 6 (six) hours as needed.    .Marland Kitchen  allopurinol (ZYLOPRIM) 100 MG tablet Take 100 mg by mouth daily.    Marland Kitchen. atorvastatin (LIPITOR) 20 MG tablet Take 20 mg by mouth daily.    . calcitRIOL (ROCALTROL) 0.25 MCG capsule Take 0.25 mcg by mouth daily.     . carvedilol (COREG) 3.125 MG tablet Take 3.125 mg by mouth 2 (two) times daily with a meal.    . dimenhyDRINATE (DRAMAMINE) 50 MG tablet Take 50-100 mg by mouth every 8 (eight) hours as needed for nausea.    . furosemide (LASIX) 80 MG tablet Take 80 mg by mouth daily.    Marland Kitchen. glipiZIDE (GLUCOTROL) 5 MG tablet Take 5 mg by mouth 2 (two) times daily before a meal.     . insulin glargine (LANTUS) 100 UNIT/ML injection Inject 25 Units into the skin daily.     . insulin lispro (HUMALOG) 100 UNIT/ML injection Inject 15  Units into the skin 3 (three) times daily before meals.    . metolazone (ZAROXOLYN) 2.5 MG tablet Take one tablet as needed for weight gain greater than 3 pounds in one day or 5 pounds in one week. 30 tablet 2  . OXYGEN Inhale into the lungs.    . pantoprazole (PROTONIX) 40 MG tablet Take 40 mg by mouth daily.     . sacubitril-valsartan (ENTRESTO) 49-51 MG Take 1 tablet by mouth 2 (two) times daily. 60 tablet 3  . spironolactone (ALDACTONE) 25 MG tablet Take 25 mg by mouth daily.    Marland Kitchen. warfarin (COUMADIN) 4 MG tablet Take 4 mg by mouth daily. 1/2 tab Monday and Friday and  One whole tab other days     No current facility-administered medications for this encounter.    Vitals:   09/01/17 1207  BP: 116/76  Pulse: 87  SpO2: 98%  Weight: 254 lb 8 oz (115.4 kg)   Wt Readings from Last 3 Encounters:  09/01/17 254 lb 8 oz (115.4 kg)  07/08/17 246 lb 8 oz (111.8 kg)  05/18/17 251 lb 12.8 oz (114.2 kg)    PHYSICAL EXAM: General: Elderly appearing. No resp difficulty. Wearing O2 HEENT: Normal Neck: Supple. JVP 6-7 cm. Carotids 2+ bilat; no bruits. No thyromegaly or nodule noted. Cor: PMI laterally displaced. Irregularly irregular. No M/G/R.  Lungs: CTAB, normal effort. Abdomen: Obese Soft, non-tender, distended, no HSM. No bruits or masses. +BS  Extremities: No cyanosis, clubbing, or rash. R and LLE no edema.  Neuro: Alert & orientedx3, cranial nerves grossly intact. moves all 4 extremities w/o difficulty. Affect pleasant   ASSESSMENT & PLAN: 1. Chronic Systolic HF: ICM, Echo 12/2016 with EF 30-35%.  S/p Medtronic ICD 10/14/2016. - Stable NYHA II-III.  - Volume status stable on exam and optivol, but belly slightly distended. Suspect may just be weight gain over holidays.  - Continue lasix 80 mg daily for now. Can take extra 80 mg in evening as needed.  - Continue metolazone 2.5 mg as needed.  - Continue Spiro 25 mg daily.  - Continue coreg 3.125 mg BID.  - Continue Entresto 49/51 mg BID.     - Reinforced fluid restriction to < 2 L daily, sodium restriction to less than 2000 mg daily, and the importance of daily weights.   2. ICM: with history of CAD in 2006 - No s/s of ischemia. - Can stop ASA with warfarin  3. Chronic A fib - CHADSVasc - 6. Rate controlled.   - Continue warfarin. Denies bleeding.  4. DM: - Per PCP.  5. CKD 3:  -  Creatinine stable on lab work 08/24/17 Cr 1.61.  6. Gout - Stable.  7. Chronic respiratory failure - Stable.   Stable overall. RTC 4-6 months. Recent labs stable in Care Everywhere.  Graciella Freer, PA-C  12:18 PM   Patient seen and examined with the above-signed Advanced Practice Provider and/or Housestaff. I personally reviewed laboratory data, imaging studies and relevant notes. I independently examined the patient and formulated the important aspects of the plan. I have edited the note to reflect any of my changes or salient points. I have personally discussed the plan with the patient and/or family.  Remains stable NYHA II-III. Volume status looks good despite weight gain. Renal function stable. Has chronic AF with good rate control. Tolerating warfarin. Will stop ASA. ICD interrogated personally. No VT. Activity level 1-2 hours. Volume status mildly elevated. Reinforced need for daily weights and reviewed use of sliding scale diuretics.  Arvilla Meres, MD  10:41 PM

## 2017-09-16 ENCOUNTER — Other Ambulatory Visit: Payer: Self-pay | Admitting: Cardiology

## 2017-10-12 ENCOUNTER — Other Ambulatory Visit (HOSPITAL_COMMUNITY): Payer: Self-pay | Admitting: Internal Medicine

## 2017-12-02 ENCOUNTER — Ambulatory Visit (HOSPITAL_COMMUNITY)
Admission: RE | Admit: 2017-12-02 | Discharge: 2017-12-02 | Disposition: A | Discharge status: Home / Self Care | Payer: Medicare Other | Source: Ambulatory Visit | Attending: Internal Medicine | Admitting: Internal Medicine

## 2017-12-02 VITALS — BP 100/64 | HR 75 | Wt 245.8 lb

## 2017-12-02 DIAGNOSIS — Z951 Presence of aortocoronary bypass graft: Secondary | ICD-10-CM | POA: Diagnosis not present

## 2017-12-02 DIAGNOSIS — Z9981 Dependence on supplemental oxygen: Secondary | ICD-10-CM | POA: Insufficient documentation

## 2017-12-02 DIAGNOSIS — J449 Chronic obstructive pulmonary disease, unspecified: Secondary | ICD-10-CM | POA: Insufficient documentation

## 2017-12-02 DIAGNOSIS — I255 Ischemic cardiomyopathy: Secondary | ICD-10-CM | POA: Insufficient documentation

## 2017-12-02 DIAGNOSIS — E669 Obesity, unspecified: Secondary | ICD-10-CM | POA: Insufficient documentation

## 2017-12-02 DIAGNOSIS — Z794 Long term (current) use of insulin: Secondary | ICD-10-CM | POA: Diagnosis not present

## 2017-12-02 DIAGNOSIS — J9610 Chronic respiratory failure, unspecified whether with hypoxia or hypercapnia: Secondary | ICD-10-CM | POA: Insufficient documentation

## 2017-12-02 DIAGNOSIS — I2510 Atherosclerotic heart disease of native coronary artery without angina pectoris: Secondary | ICD-10-CM | POA: Insufficient documentation

## 2017-12-02 DIAGNOSIS — I130 Hypertensive heart and chronic kidney disease with heart failure and stage 1 through stage 4 chronic kidney disease, or unspecified chronic kidney disease: Secondary | ICD-10-CM | POA: Diagnosis not present

## 2017-12-02 DIAGNOSIS — I4510 Unspecified right bundle-branch block: Secondary | ICD-10-CM | POA: Diagnosis not present

## 2017-12-02 DIAGNOSIS — G4733 Obstructive sleep apnea (adult) (pediatric): Secondary | ICD-10-CM | POA: Insufficient documentation

## 2017-12-02 DIAGNOSIS — E785 Hyperlipidemia, unspecified: Secondary | ICD-10-CM | POA: Diagnosis not present

## 2017-12-02 DIAGNOSIS — M109 Gout, unspecified: Secondary | ICD-10-CM | POA: Insufficient documentation

## 2017-12-02 DIAGNOSIS — E1122 Type 2 diabetes mellitus with diabetic chronic kidney disease: Secondary | ICD-10-CM | POA: Diagnosis not present

## 2017-12-02 DIAGNOSIS — I872 Venous insufficiency (chronic) (peripheral): Secondary | ICD-10-CM | POA: Insufficient documentation

## 2017-12-02 DIAGNOSIS — I482 Chronic atrial fibrillation, unspecified: Secondary | ICD-10-CM

## 2017-12-02 DIAGNOSIS — Z8711 Personal history of peptic ulcer disease: Secondary | ICD-10-CM | POA: Insufficient documentation

## 2017-12-02 DIAGNOSIS — Z8673 Personal history of transient ischemic attack (TIA), and cerebral infarction without residual deficits: Secondary | ICD-10-CM | POA: Insufficient documentation

## 2017-12-02 DIAGNOSIS — N183 Chronic kidney disease, stage 3 (moderate): Secondary | ICD-10-CM | POA: Insufficient documentation

## 2017-12-02 DIAGNOSIS — Z7901 Long term (current) use of anticoagulants: Secondary | ICD-10-CM | POA: Diagnosis not present

## 2017-12-02 DIAGNOSIS — Z6841 Body Mass Index (BMI) 40.0 and over, adult: Secondary | ICD-10-CM | POA: Insufficient documentation

## 2017-12-02 DIAGNOSIS — I5022 Chronic systolic (congestive) heart failure: Secondary | ICD-10-CM | POA: Diagnosis present

## 2017-12-02 DIAGNOSIS — Z79899 Other long term (current) drug therapy: Secondary | ICD-10-CM | POA: Insufficient documentation

## 2017-12-02 NOTE — Progress Notes (Signed)
Advanced Heart Failure Clinic Note    Patient ID: Donald Dunlap, male   DOB: [birth year], 67 y.o.   MRN: [id number]   PCP: Madelyn FlavorsSydney harris Primary Cardiologist: Dr Ladean RayaSrinvisan [facility]([place]) Referring: Graciela HusbandsKlein (EP) HF: Dr. Gala RomneyBensimhon   HPI:  Donald Dunlap is a 67 y.o. male with a history of CAD with [facility]G in 2006 at [facility] complicated by sternal dehiscence, chronic atrial fib on warfarin, h/o multiple strokes, COPD on home O2, CKD 3 with a creatinine of 1.5-  2.0. DM2, obesity, OSA, RBBB and chronic systolic HF. Refereed to HF Clinic by Dr. Graciela HusbandsKlein.   Admitted 01/07/17-01/21/17 with volume overload and low output. RHC on 01/15/17 with Fick cardiac output/index 4.3/1.9. Echo with EF 30-35%, LA severely dilated.  Co ox 49%. Milrinone started to facilitate diuresis. Eventually weaned off. He was started on Entresto. Discharge weight 243 pounds.   He presents today for regular follow up. Here with his wife. Overall feeling fine. His primary cardiologist increased his spiro to 25 BID to help manage his fluid. Able to do ADLs. He is SOB with steps and walking around the yard - this is unchanged. Has substernal CP that occurs randomly, that lasts a few minutes. No associated symptoms. Sleeps on 4 pillows or in recliner. No PND or edema recently. No dizziness or bleeding. Has taken metolazone twice in the last month. Weights 241-248 lbs. Down about 5 pounds. Compliant with medications. Does not add salt. Limits fluid to <2 L. Had a fall last week due to his knee giving out. Refuses HHPT.   Labs in care everywhere 08/2017: K 4.6, Cr 1.61  Review of systems complete and found to be negative unless listed in HPI.    Past Medical History:  Diagnosis Date  . Atrial fibrillation, permanent (HCC)   . Blood in stool   . Chronic systolic congestive heart failure, NYHA class 3 (HCC)   . Diabetes (HCC)   . Edema   . Esophageal reflux   . Gout   . HLD (hyperlipidemia)   . HTN (hypertension)   . Iron deficiency anemia  10/02/2016  . Ischemic cardiomyopathy   . Migraine aura, persistent   . Morbid obesity with BMI of 40.0-44.9, adult (HCC)   . OSA (obstructive sleep apnea)   . Peptic ulcer   . RBBB LAFB   . Venous insufficiency    SH:  Social History   Socioeconomic History  . Marital status: Married    Spouse name: Not on file  . Number of children: Not on file  . Years of education: Not on file  . Highest education level: Not on file  Social Needs  . Financial resource strain: Not on file  . Food insecurity - worry: Not on file  . Food insecurity - inability: Not on file  . Transportation needs - medical: Not on file  . Transportation needs - non-medical: Not on file  Occupational History  . Not on file  Tobacco Use  . Smoking status: Never Smoker  . Smokeless tobacco: Never Used  Substance and Sexual Activity  . Alcohol use: No  . Drug use: No  . Sexual activity: Not on file  Other Topics Concern  . Not on file  Social History Narrative  . Not on file   FH: No h/o familial cardiomyopathy or connective tissue disease  Current Outpatient Medications  Medication Sig Dispense Refill  . acetaminophen (TYLENOL) 500 MG tablet Take 500 mg by mouth every 6 (six) hours as needed.    .Marland Kitchen  allopurinol (ZYLOPRIM) 100 MG tablet Take 100 mg by mouth daily.    Marland Kitchen atorvastatin (LIPITOR) 20 MG tablet Take 20 mg by mouth daily.    . calcitRIOL (ROCALTROL) 0.25 MCG capsule Take 0.25 mcg by mouth daily.     . carvedilol (COREG) 3.125 MG tablet Take 6.25 mg by mouth 2 (two) times daily with a meal.    . dimenhyDRINATE (DRAMAMINE) 50 MG tablet Take 50-100 mg by mouth every 8 (eight) hours as needed for nausea.    Marland Kitchen ENTRESTO 49-51 MG TAKE 1 TABLET BY MOUTH TWICE DAILY 60 tablet 3  . furosemide (LASIX) 80 MG tablet Take 80 mg by mouth daily.    Marland Kitchen glipiZIDE (GLUCOTROL) 5 MG tablet Take 5 mg by mouth 2 (two) times daily before a meal.     . insulin glargine (LANTUS) 100 UNIT/ML injection Inject 25 Units into  the skin daily.     . insulin lispro (HUMALOG) 100 UNIT/ML injection Inject 15 Units into the skin 3 (three) times daily before meals.    . metolazone (ZAROXOLYN) 2.5 MG tablet Take one tablet as needed for weight gain greater than 3 pounds in one day or 5 pounds in one week. 30 tablet 2  . OXYGEN Inhale into the lungs.    . pantoprazole (PROTONIX) 40 MG tablet Take 40 mg by mouth daily.     Marland Kitchen spironolactone (ALDACTONE) 25 MG tablet Take 25 mg by mouth 2 (two) times daily.    Marland Kitchen warfarin (COUMADIN) 4 MG tablet Take 4 mg by mouth daily. 1/2 tab Monday and Friday and  One whole tab other days     No current facility-administered medications for this encounter.    Vitals:   12/02/17 1121  BP: 100/64  Pulse: 75  SpO2: 94%  Weight: 245 lb 12 oz (111.5 kg)   Wt Readings from Last 3 Encounters:  12/02/17 245 lb 12 oz (111.5 kg)  09/01/17 254 lb 8 oz (115.4 kg)  07/08/17 246 lb 8 oz (111.8 kg)    PHYSICAL EXAM: General:No resp difficulty. HEENT: Normal Anicteric Neck: Supple. JVP 5-6  Carotids 2+ bilat; no bruits. No thyromegaly or nodule noted. Cor: PMI nondisplaced. Irregular rhythm, No M/G/R noted Lungs: diminished bases. No wheezing Abdomen: Obese Soft, non-tender, non-distended, no HSM. No bruits or masses. +BS  Extremities: No cyanosis, clubbing, or rash. R and LLE trace edema, discoloration and scabs Neuro: Alert & orientedx3, cranial nerves grossly intact. moves all 4 extremities w/o difficulty. Affect pleasant   ASSESSMENT & PLAN: 1. Chronic Systolic HF: ICM, Echo 12/2016 with EF 30-35%.  S/p Medtronic ICD 10/14/2016. - Stable NYHA II-III. Says he is supposed to be getting BiV upgrade - Volume status stable on exam - Continue lasix 80 mg daily. Can take extra 80 mg in evening as needed.  - Continue metolazone 2.5 mg as needed.  - Continue Spiro 25 mg BID. - Continue coreg 6.25 mg BID.  - Continue Entresto 49/51 mg BID.  BP too low to titrate.  - Reinforced fluid restriction  to < 2 L daily, sodium restriction to less than 2000 mg daily, and the importance of daily weights.   - Will need repeat echo with Dr. Ladean Raya. Family will set this up.  2. ICM: with history of CAD in 2006 - No s/s of ischemia. - No ASA with warfarin  3. Chronic A fib - CHADSVasc - 6. Rate controlled.   - Continue warfarin. Has occasional blood on toilet paper, but thinks  it is his hemorrhoids. No other bleeding.  4. DM: - Per PCP.  5. CKD 3:  - Creatinine 1.61 08/2017 (Care everywhere) 6. Gout - Stable.  7. Chronic respiratory failure - Stable. Uses 2L O2 as needed at home.  8. Obesity -Body mass index is 36.29 kg/m.   Alford Highland, NP  11:52 AM   Patient seen and examined with the above-signed Advanced Practice Provider and/or Housestaff. I personally reviewed laboratory data, imaging studies and relevant notes. I independently examined the patient and formulated the important aspects of the plan. I have edited the note to reflect any of my changes or salient points. I have personally discussed the plan with the patient and/or family.  Overall doing fairly well. Chronic NYHA III. Situation complicated by obesity and COPD. Volume status looks good. Being considered for CRT-D upgrade with Dr. Ladean Raya. AF is chronic and rate-controlled. Continue warfarin. Refuses NOAC. He will get [place] in [place].   Arvilla Meres, MD  10:00 PM

## 2017-12-02 NOTE — Patient Instructions (Signed)
Your physician recommends that you schedule a follow-up appointment in: 4 months with Dr.Bensimhon   Do the following things EVERYDAY: 1) Weigh yourself in the morning before breakfast. Write it down and keep it in a log. 2) Take your medicines as prescribed 3) Eat low salt foods-Limit salt (sodium) to 2000 mg per day.  4) Stay as active as you can everyday 5) Limit all fluids for the day to less than 2 liters   

## 2018-01-29 IMAGING — CR DG CHEST 1V PORT
1 series · 1 of 1 positions shown · non-contrast
Comparison: Single-view of the chest 01/07/2017.

CLINICAL DATA: Status post line placement.

EXAM:
PORTABLE CHEST 1 VIEW

[AP]
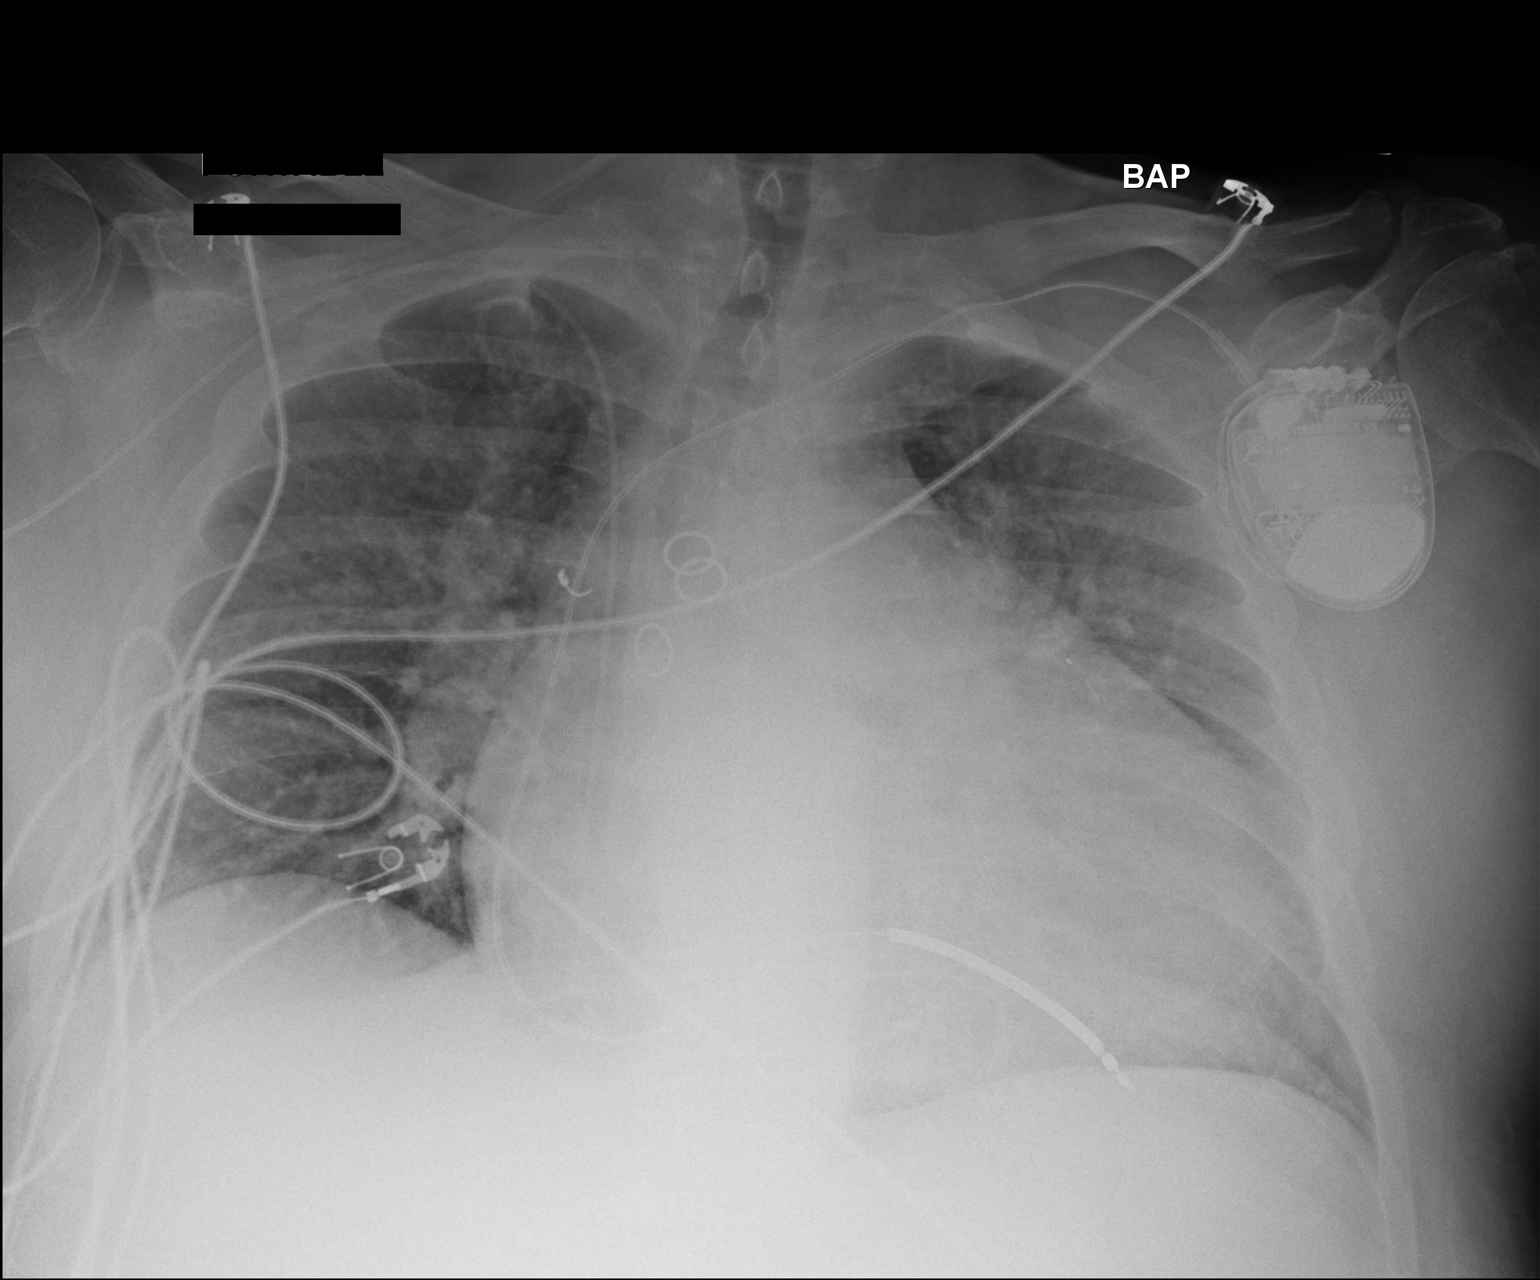

[1 of 1 positions shown; findings below may reference images not displayed]

FINDINGS: A new PICC is in place with the tip projecting in the right atrium.
The line could be withdrawn 3.5 cm for positioning near the superior
cavoatrial junction. AICD is unchanged. There is marked
cardiomegaly. Pulmonary edema seen on the comparison study has
improved. No pneumothorax or pleural fluid.
IMPRESSION: Tip of right PICC projects in the right atrium. The line could be
withdrawn approximately 3.5 cm for positioning near the superior
cavoatrial junction.

Marked cardiomegaly. Pulmonary edema seen on the prior study has
improved with mild interstitial edema persisting.

## 2018-02-21 ENCOUNTER — Emergency Department (HOSPITAL_COMMUNITY)
Admission: EM | Admit: 2018-02-21 | Discharge: 2018-02-21 | Disposition: A | Discharge status: Home / Self Care | Payer: Medicare Other | Attending: Emergency Medicine | Admitting: Emergency Medicine

## 2018-02-21 ENCOUNTER — Emergency Department (HOSPITAL_COMMUNITY): Payer: Medicare Other

## 2018-02-21 ENCOUNTER — Encounter (HOSPITAL_COMMUNITY): Payer: Self-pay | Admitting: Emergency Medicine

## 2018-02-21 DIAGNOSIS — W57XXXA Bitten or stung by nonvenomous insect and other nonvenomous arthropods, initial encounter: Secondary | ICD-10-CM

## 2018-02-21 DIAGNOSIS — Z7901 Long term (current) use of anticoagulants: Secondary | ICD-10-CM | POA: Diagnosis not present

## 2018-02-21 DIAGNOSIS — I130 Hypertensive heart and chronic kidney disease with heart failure and stage 1 through stage 4 chronic kidney disease, or unspecified chronic kidney disease: Secondary | ICD-10-CM | POA: Insufficient documentation

## 2018-02-21 DIAGNOSIS — R51 Headache: Secondary | ICD-10-CM | POA: Diagnosis not present

## 2018-02-21 DIAGNOSIS — I2510 Atherosclerotic heart disease of native coronary artery without angina pectoris: Secondary | ICD-10-CM | POA: Insufficient documentation

## 2018-02-21 DIAGNOSIS — Z91040 Latex allergy status: Secondary | ICD-10-CM | POA: Diagnosis not present

## 2018-02-21 DIAGNOSIS — Z794 Long term (current) use of insulin: Secondary | ICD-10-CM | POA: Insufficient documentation

## 2018-02-21 DIAGNOSIS — N183 Chronic kidney disease, stage 3 (moderate): Secondary | ICD-10-CM | POA: Insufficient documentation

## 2018-02-21 DIAGNOSIS — Z79899 Other long term (current) drug therapy: Secondary | ICD-10-CM | POA: Insufficient documentation

## 2018-02-21 DIAGNOSIS — E1122 Type 2 diabetes mellitus with diabetic chronic kidney disease: Secondary | ICD-10-CM | POA: Insufficient documentation

## 2018-02-21 DIAGNOSIS — I5022 Chronic systolic (congestive) heart failure: Secondary | ICD-10-CM | POA: Insufficient documentation

## 2018-02-21 HISTORY — DX: Disorder of kidney and ureter, unspecified: N28.9

## 2018-02-21 LAB — CBC
HEMATOCRIT: 48.9 % (ref 39.0–52.0)
Hemoglobin: 16.1 g/dL (ref 13.0–17.0)
MCH: 31.8 pg (ref 26.0–34.0)
MCHC: 32.9 g/dL (ref 30.0–36.0)
MCV: 96.4 fL (ref 78.0–100.0)
Platelets: 142 10*3/uL — ABNORMAL LOW (ref 150–400)
RBC: 5.07 MIL/uL (ref 4.22–5.81)
RDW: 14.6 % (ref 11.5–15.5)
WBC: 11 10*3/uL — AB (ref 4.0–10.5)

## 2018-02-21 LAB — URINALYSIS, ROUTINE W REFLEX MICROSCOPIC
BILIRUBIN URINE: NEGATIVE
Glucose, UA: NEGATIVE mg/dL
Hgb urine dipstick: NEGATIVE
Ketones, ur: NEGATIVE mg/dL
LEUKOCYTES UA: NEGATIVE
NITRITE: NEGATIVE
PH: 5 (ref 5.0–8.0)
PROTEIN: NEGATIVE mg/dL
Specific Gravity, Urine: 1.008 (ref 1.005–1.030)

## 2018-02-21 LAB — BASIC METABOLIC PANEL
ANION GAP: 7 (ref 5–15)
BUN: 50 mg/dL — ABNORMAL HIGH (ref 6–20)
CO2: 28 mmol/L (ref 22–32)
CREATININE: 1.51 mg/dL — AB (ref 0.61–1.24)
Calcium: 10 mg/dL (ref 8.9–10.3)
Chloride: 101 mmol/L (ref 101–111)
GFR, EST AFRICAN AMERICAN: 54 mL/min — AB (ref >60)
GFR, EST NON AFRICAN AMERICAN: 46 mL/min — AB (ref >60)
Glucose, Bld: 169 mg/dL — ABNORMAL HIGH (ref 65–99)
Potassium: 6.2 mmol/L — ABNORMAL HIGH (ref 3.5–5.1)
Sodium: 136 mmol/L (ref 135–145)

## 2018-02-21 LAB — HEPATIC FUNCTION PANEL
ALT: 23 U/L (ref 17–63)
AST: 20 U/L (ref 15–41)
Albumin: 3.8 g/dL (ref 3.5–5.0)
Alkaline Phosphatase: 64 U/L (ref 38–126)
BILIRUBIN DIRECT: 0.2 mg/dL (ref 0.1–0.5)
BILIRUBIN INDIRECT: 0.9 mg/dL (ref 0.3–0.9)
BILIRUBIN TOTAL: 1.1 mg/dL (ref 0.3–1.2)
Total Protein: 7.6 g/dL (ref 6.5–8.1)

## 2018-02-21 LAB — POTASSIUM: Potassium: 5.8 mmol/L — ABNORMAL HIGH (ref 3.5–5.1)

## 2018-02-21 LAB — PROTIME-INR
INR: 2.05
Prothrombin Time: 23 seconds — ABNORMAL HIGH (ref 11.4–15.2)

## 2018-02-21 MED ORDER — DOXYCYCLINE HYCLATE 100 MG PO TABS
100.0000 mg | ORAL_TABLET | Freq: Once | ORAL | Status: AC
  Administered 2018-02-21: 100 mg via ORAL
  Filled 2018-02-21: qty 1

## 2018-02-21 MED ORDER — MORPHINE SULFATE (PF) 2 MG/ML IV SOLN
2.0000 mg | Freq: Once | INTRAVENOUS | Status: DC
  Filled 2018-02-21: qty 1

## 2018-02-21 MED ORDER — HYDROCODONE-ACETAMINOPHEN 5-325 MG PO TABS: 1.0000 | ORAL_TABLET | Freq: Four times a day (QID) | ORAL | 0 refills | Status: AC | PRN

## 2018-02-21 MED ORDER — DIPHENHYDRAMINE HCL 50 MG/ML IJ SOLN
25.0000 mg | Freq: Once | INTRAMUSCULAR | Status: AC
Start: 2018-02-21 — End: 2018-02-21
  Administered 2018-02-21: 25 mg via INTRAVENOUS
  Filled 2018-02-21: qty 1

## 2018-02-21 MED ORDER — SODIUM POLYSTYRENE SULFONATE 15 GM/60ML PO SUSP
30.0000 g | Freq: Once | ORAL | Status: AC
  Administered 2018-02-21: 30 g via ORAL
  Filled 2018-02-21: qty 120

## 2018-02-21 MED ORDER — SODIUM CHLORIDE 0.9 % IV BOLUS
1000.0000 mL | Freq: Once | INTRAVENOUS | Status: AC
  Administered 2018-02-21: 1000 mL via INTRAVENOUS

## 2018-02-21 MED ORDER — KETOROLAC TROMETHAMINE 30 MG/ML IJ SOLN
15.0000 mg | Freq: Once | INTRAMUSCULAR | Status: AC
  Administered 2018-02-21: 15 mg via INTRAVENOUS
  Filled 2018-02-21: qty 1

## 2018-02-21 MED ORDER — SODIUM CHLORIDE 0.9 % IV BOLUS
500.0000 mL | Freq: Once | INTRAVENOUS | Status: AC
  Administered 2018-02-21: 500 mL via INTRAVENOUS

## 2018-02-21 MED ORDER — DOXYCYCLINE HYCLATE 100 MG PO CAPS: 100.0000 mg | ORAL_CAPSULE | Freq: Two times a day (BID) | ORAL | 0 refills | Status: AC

## 2018-02-21 MED ORDER — TRAMADOL HCL 50 MG PO TABS: 50.0000 mg | ORAL_TABLET | Freq: Four times a day (QID) | ORAL | 0 refills | Status: AC | PRN

## 2018-02-21 NOTE — ED Triage Notes (Signed)
Pt got 6 ticks off of him a week ago and has developed a headache.  Pt c/o itching.

## 2018-02-21 NOTE — ED Notes (Signed)
Call Donald Dunlap [phone]

## 2018-02-21 NOTE — ED Provider Notes (Signed)
[facility] EMERGENCY DEPARTMENT Provider Note   CSN: [id number] Arrival date & time: 02/21/18  1219     History   Chief Complaint Chief Complaint  Patient presents with  . Headache  . Insect Bite    HPI Donald Dunlap is a 67 y.o. male.  Patient presents to the hospital complaining of a headache and history of tick bites a week ago.  No rash  The history is provided by the patient. No language interpreter was used.  Headache   This is a new problem. The current episode started more than 1 week ago. The problem occurs constantly. The problem has not changed since onset.The headache is associated with nothing. The pain is located in the bilateral region. The quality of the pain is described as dull. The pain is at a severity of 4/10. The pain is moderate. The pain does not radiate. Pertinent negatives include no anorexia.    Past Medical History:  Diagnosis Date  . Atrial fibrillation, permanent (HCC)   . Blood in stool   . Chronic systolic congestive heart failure, NYHA class 3 (HCC)   . Diabetes (HCC)   . Edema   . Esophageal reflux   . Gout   . HLD (hyperlipidemia)   . HTN (hypertension)   . Iron deficiency anemia 10/02/2016  . Ischemic cardiomyopathy   . Migraine aura, persistent   . Morbid obesity with BMI of 40.0-44.9, adult (HCC)   . OSA (obstructive sleep apnea)   . Peptic ulcer   . RBBB LAFB   . Renal disorder   . Venous insufficiency     Patient Active Problem List   Diagnosis Date Noted  . OSA (obstructive sleep apnea) 01/07/2017  . Chronic anticoagulation 01/07/2017  . CKD (chronic kidney disease), stage III (HCC) 01/07/2017  . Open wound of left lower extremity 01/07/2017  . Open wound of right lower extremity 01/07/2017  . History of colonic polyps 10/23/2016  . Iron deficiency anemia 10/02/2016  . Chronic systolic heart failure (HCC) 12/13/2014  . Chronic respiratory failure (HCC) 12/13/2014  . Morbid obesity (HCC) 12/13/2014  . Chronic a-fib  (HCC) 12/13/2014  . CAD in native artery 12/13/2014    Past Surgical History:  Procedure Laterality Date  . KNEE SURGERY     arthroscopic R & L  . left 3 toes    . right hand surgery    . RIGHT HEART CATH N/A 01/15/2017   Procedure: Right Heart Cath;  Surgeon: Dolores Patty, MD;  Location: [facility] INVASIVE CV LAB;  Service: Cardiovascular;  Laterality: N/A;  . SHOULDER SURGERY     bilateral  . VEIN BYPASS SURGERY          Home Medications    Prior to Admission medications   Medication Sig Start Date End Date Taking? Authorizing Provider  acetaminophen (TYLENOL) 500 MG tablet Take 500 mg by mouth every 6 (six) hours as needed.   Yes [provider]  allopurinol (ZYLOPRIM) 100 MG tablet Take 100 mg by mouth daily.   Yes [provider]  calcitRIOL (ROCALTROL) 0.25 MCG capsule Take 0.25 mcg by mouth daily.    Yes [provider]  carvedilol (COREG) 3.125 MG tablet Take 6.25 mg by mouth 2 (two) times daily with a meal.   Yes [provider]  diphenhydrAMINE (BENADRYL) 25 MG tablet Take 25 mg by mouth every 6 (six) hours as needed for itching or allergies.   Yes [provider]  ENTRESTO 49-51 MG TAKE  1 TABLET BY MOUTH TWICE DAILY 10/13/17  Yes Bensimhon, Bevelyn Buckles, MD  furosemide (LASIX) 80 MG tablet Take 40-80 mg by mouth See admin instructions. Alternate taking 80mg  with 40mg  daily (80mg , 40mg , 80mg , etc..)   Yes [provider]  glipiZIDE (GLUCOTROL) 5 MG tablet Take 5 mg by mouth 2 (two) times daily before a meal.    Yes [provider]  insulin glargine (LANTUS) 100 UNIT/ML injection Inject 25 Units into the skin every morning.    Yes [provider]  insulin lispro (HUMALOG) 100 UNIT/ML injection Inject 10-20 Units into the skin 3 (three) times daily before meals. Sliding Scale   Yes [provider]  Meclizine HCl (BONINE) 25 MG CHEW Chew 1-2 tablets by mouth daily as needed (for motion sickness).   Yes  [provider]  metolazone (ZAROXOLYN) 2.5 MG tablet Take one tablet as needed for weight gain greater than 3 pounds in one day or 5 pounds in one week. Patient taking differently: Take 2.5 mg by mouth as needed (Take one tablet as needed for weight gain greater than 3 pounds in one day or 5 pounds in one week.).  01/21/17  Yes Little Ishikawa, NP  OXYGEN Inhale 2 L into the lungs daily.    Yes [provider]  pantoprazole (PROTONIX) 40 MG tablet Take 40 mg by mouth daily.    Yes [provider]  spironolactone (ALDACTONE) 25 MG tablet Take 25 mg by mouth daily.    Yes [provider]  warfarin (COUMADIN) 4 MG tablet Take 2-4 mg by mouth See admin instructions. 2mg  on Monday, Wednesday, and Friday then 4mg  on all other days   Yes [provider]  doxycycline (VIBRAMYCIN) 100 MG capsule Take 1 capsule (100 mg total) by mouth 2 (two) times daily. One po bid x 7 days 02/21/18   Bethann Berkshire, MD  HYDROcodone-acetaminophen (NORCO/VICODIN) 5-325 MG tablet Take 1 tablet by mouth every 6 (six) hours as needed for moderate pain. 02/21/18   Bethann Berkshire, MD  traMADol (ULTRAM) 50 MG tablet Take 1 tablet (50 mg total) by mouth every 6 (six) hours as needed. 02/21/18   Bethann Berkshire, MD    Family History History reviewed. No pertinent family history.  Social History Social History   Tobacco Use  . Smoking status: Never Smoker  . Smokeless tobacco: Never Used  Substance Use Topics  . Alcohol use: No  . Drug use: No     Allergies   Morphine and related; Oxycodone; and Latex   Review of Systems Review of Systems  Constitutional: Negative for appetite change and fatigue.  HENT: Negative for congestion, ear discharge and sinus pressure.   Eyes: Negative for discharge.  Respiratory: Negative for cough.   Cardiovascular: Negative for chest pain.  Gastrointestinal: Negative for abdominal pain, anorexia and diarrhea.  Genitourinary: Negative for frequency  and hematuria.  Musculoskeletal: Negative for back pain.  Skin: Negative for rash.  Neurological: Positive for headaches. Negative for seizures.  Psychiatric/Behavioral: Negative for hallucinations.     Physical Exam Updated Vital Signs BP 101/69 (BP Location: Left Arm)   Pulse 65   Temp (!) 97.3 F (36.3 C) (Tympanic)   Resp 17   Ht 5\' 10"  (1.778 m)   Wt 109.3 kg (241 lb)   SpO2 98%   BMI 34.58 kg/m   Physical Exam  Constitutional: He is oriented to person, place, and time. He appears well-developed.  HENT:  Head: Normocephalic.  Supple neck  Eyes: Conjunctivae and EOM are normal. No scleral icterus.  Neck: Neck supple. No thyromegaly present.  Cardiovascular: Normal rate and regular rhythm. Exam reveals no gallop and no friction rub.  No murmur heard. Pulmonary/Chest: No stridor. He has no wheezes. He has no rales. He exhibits no tenderness.  Abdominal: He exhibits no distension. There is no tenderness. There is no rebound.  Musculoskeletal: Normal range of motion. He exhibits no edema.  Lymphadenopathy:    He has no cervical adenopathy.  Neurological: He is oriented to person, place, and time. He exhibits normal muscle tone. Coordination normal.  Skin: No rash noted. No erythema.  Psychiatric: He has a normal mood and affect. His behavior is normal.     ED Treatments / Results  Labs (all labs ordered are listed, but only abnormal results are displayed) Labs Reviewed  CBC - Abnormal; Notable for the following components:      Result Value   WBC 11.0 (*)    Platelets 142 (*)    All other components within normal limits  URINALYSIS, ROUTINE W REFLEX MICROSCOPIC - Abnormal; Notable for the following components:   Color, Urine STRAW (*)    All other components within normal limits  BASIC METABOLIC PANEL - Abnormal; Notable for the following components:   Potassium 6.2 (*)    Glucose, Bld 169 (*)    BUN 50 (*)    Creatinine, Ser 1.51 (*)    GFR calc non Af Amer  46 (*)    GFR calc Af Amer 54 (*)    All other components within normal limits  PROTIME-INR - Abnormal; Notable for the following components:   Prothrombin Time 23.0 (*)    All other components within normal limits  POTASSIUM - Abnormal; Notable for the following components:   Potassium 5.8 (*)    All other components within normal limits  HEPATIC FUNCTION PANEL  ROCKY MTN SPOTTED FVR ABS PNL(IGG+IGM)  B. BURGDORFI ANTIBODIES    EKG EKG Interpretation  Date/Time:  Monday February 21 2018 12:35:21 EDT Ventricular Rate:  77 PR Interval:    QRS Duration: 120 QT Interval:  410 QTC Calculation: 463 R Axis:   -43 Text Interpretation:  Normal sinus rhythm Left axis deviation Right bundle branch block Minimal voltage criteria for LVH, may be normal variant Possible Lateral infarct , age undetermined Abnormal ECG Confirmed by Bethann BerkshireZammit, Maureen Delatte [phone]([id number]) on 02/21/2018 2:29:19 PM   Radiology Ct Head Wo Contrast  Result Date: 02/21/2018 CLINICAL DATA:  Headache after removing tics. EXAM: CT HEAD WITHOUT CONTRAST TECHNIQUE: Contiguous axial images were obtained from the base of the skull through the vertex without intravenous contrast. COMPARISON:  None FINDINGS: Brain: There is mild diffuse low-attenuation within the subcortical and periventricular white matter compatible with chronic microvascular disease. Focal area of encephalomalacia within the right posterior parietal lobe is identified compatible with chronic infarct. No evidence for acute infarction, intracranial hemorrhage, hydrocephalus, extra-axial collection or mass lesion/mass effect. Vascular: No hyperdense vessel or unexpected calcification. Skull: Normal. Negative for fracture or focal lesion. Sinuses/Orbits: No acute finding. Other: None. IMPRESSION: 1. No acute intracranial abnormalities. 2. Chronic small vessel ischemic disease and brain atrophy. 3. Chronic encephalomalacia is identified within the posterior right parietal lobe.  Electronically Signed   By: Signa Kellaylor  Stroud M.D.   On: 02/21/2018 14:29    Procedures Procedures (including critical care time)  Medications Ordered in ED Medications  morphine 2 MG/ML injection 2 mg (2 mg Intravenous Not Given 02/21/18 1452)  sodium chloride  0.9 % bolus 1,000 mL (1,000 mLs Intravenous New Bag/Given 02/21/18 1812)  doxycycline (VIBRA-TABS) tablet 100 mg (has no administration in time range)  sodium polystyrene (KAYEXALATE) 15 GM/60ML suspension 30 g (has no administration in time range)  ketorolac (TORADOL) 30 MG/ML injection 15 mg (15 mg Intravenous Given 02/21/18 1452)  sodium chloride 0.9 % bolus 500 mL (0 mLs Intravenous Stopped 02/21/18 1625)  diphenhydrAMINE (BENADRYL) injection 25 mg (25 mg Intravenous Given 02/21/18 1739)     Initial Impression / Assessment and Plan / ED Course  I have reviewed the triage vital signs and the nursing notes.  Pertinent labs & imaging results that were available during my care of the patient were reviewed by me and considered in my medical decision making (see chart for details).     Labs show patient has elevated potassium at 5.8 along with dehydration.  Patient was given a liter half of fluids.  He also was given some Kayexalate.  Patient will be treated with doxycycline for possible infection from tick exposure.  He also is instructed to call his nephrologist tomorrow and let them know that his potassium was slightly elevated at 5.8 and they will continue to follow this.  Final Clinical Impressions(s) / ED Diagnoses   Final diagnoses:  Tick bite, initial encounter    ED Discharge Orders        Ordered    doxycycline (VIBRAMYCIN) 100 MG capsule  2 times daily     02/21/18 1908    traMADol (ULTRAM) 50 MG tablet  Every 6 hours PRN     02/21/18 1908    HYDROcodone-acetaminophen (NORCO/VICODIN) 5-325 MG tablet  Every 6 hours PRN     02/21/18 1909       Bethann Berkshire, MD 02/21/18 1913

## 2018-02-21 NOTE — Discharge Instructions (Signed)
Follow-up with your family doctor later this week for recheck.  Call your kidney doctor tomorrow and tell him that your potassium was elevated today and we wanted it rechecked soon.

## 2018-02-23 LAB — B. BURGDORFI ANTIBODIES: B burgdorferi Ab IgG+IgM: <0.91 {ISR} (ref 0.00–0.90)

## 2018-02-23 LAB — ROCKY MTN SPOTTED FVR ABS PNL(IGG+IGM)
RMSF IGG: NEGATIVE
RMSF IgM: 0.68 index (ref 0.00–0.89)

## 2018-03-02 ENCOUNTER — Other Ambulatory Visit (HOSPITAL_COMMUNITY): Payer: Self-pay | Admitting: Internal Medicine

## 2018-06-09 ENCOUNTER — Other Ambulatory Visit (HOSPITAL_COMMUNITY): Payer: Self-pay | Admitting: Student

## 2018-07-18 ENCOUNTER — Other Ambulatory Visit (HOSPITAL_COMMUNITY): Payer: Self-pay | Admitting: Internal Medicine

## 2018-08-31 ENCOUNTER — Other Ambulatory Visit: Payer: Self-pay

## 2018-08-31 ENCOUNTER — Ambulatory Visit (HOSPITAL_COMMUNITY)
Admission: RE | Admit: 2018-08-31 | Discharge: 2018-08-31 | Disposition: A | Discharge status: Home / Self Care | Payer: Medicare Other | Source: Ambulatory Visit | Attending: Internal Medicine | Admitting: Internal Medicine

## 2018-08-31 ENCOUNTER — Encounter (HOSPITAL_COMMUNITY): Payer: Self-pay | Admitting: Internal Medicine

## 2018-08-31 VITALS — BP 140/90 | HR 77 | Wt 268.0 lb

## 2018-08-31 DIAGNOSIS — I4821 Permanent atrial fibrillation: Secondary | ICD-10-CM | POA: Diagnosis not present

## 2018-08-31 DIAGNOSIS — I2510 Atherosclerotic heart disease of native coronary artery without angina pectoris: Secondary | ICD-10-CM | POA: Diagnosis not present

## 2018-08-31 DIAGNOSIS — I5022 Chronic systolic (congestive) heart failure: Secondary | ICD-10-CM | POA: Diagnosis present

## 2018-08-31 DIAGNOSIS — I482 Chronic atrial fibrillation, unspecified: Secondary | ICD-10-CM | POA: Diagnosis not present

## 2018-08-31 DIAGNOSIS — I130 Hypertensive heart and chronic kidney disease with heart failure and stage 1 through stage 4 chronic kidney disease, or unspecified chronic kidney disease: Secondary | ICD-10-CM | POA: Insufficient documentation

## 2018-08-31 DIAGNOSIS — I872 Venous insufficiency (chronic) (peripheral): Secondary | ICD-10-CM | POA: Insufficient documentation

## 2018-08-31 DIAGNOSIS — Z9981 Dependence on supplemental oxygen: Secondary | ICD-10-CM | POA: Diagnosis not present

## 2018-08-31 DIAGNOSIS — I4510 Unspecified right bundle-branch block: Secondary | ICD-10-CM | POA: Diagnosis not present

## 2018-08-31 DIAGNOSIS — Z8673 Personal history of transient ischemic attack (TIA), and cerebral infarction without residual deficits: Secondary | ICD-10-CM | POA: Diagnosis not present

## 2018-08-31 DIAGNOSIS — Z951 Presence of aortocoronary bypass graft: Secondary | ICD-10-CM | POA: Insufficient documentation

## 2018-08-31 DIAGNOSIS — I444 Left anterior fascicular block: Secondary | ICD-10-CM | POA: Diagnosis not present

## 2018-08-31 DIAGNOSIS — G4733 Obstructive sleep apnea (adult) (pediatric): Secondary | ICD-10-CM | POA: Insufficient documentation

## 2018-08-31 DIAGNOSIS — N183 Chronic kidney disease, stage 3 (moderate): Secondary | ICD-10-CM | POA: Diagnosis not present

## 2018-08-31 DIAGNOSIS — Z7901 Long term (current) use of anticoagulants: Secondary | ICD-10-CM | POA: Diagnosis not present

## 2018-08-31 DIAGNOSIS — E785 Hyperlipidemia, unspecified: Secondary | ICD-10-CM | POA: Insufficient documentation

## 2018-08-31 DIAGNOSIS — Z6841 Body Mass Index (BMI) 40.0 and over, adult: Secondary | ICD-10-CM | POA: Insufficient documentation

## 2018-08-31 DIAGNOSIS — I255 Ischemic cardiomyopathy: Secondary | ICD-10-CM | POA: Diagnosis not present

## 2018-08-31 DIAGNOSIS — M109 Gout, unspecified: Secondary | ICD-10-CM | POA: Insufficient documentation

## 2018-08-31 DIAGNOSIS — J449 Chronic obstructive pulmonary disease, unspecified: Secondary | ICD-10-CM | POA: Insufficient documentation

## 2018-08-31 DIAGNOSIS — E1122 Type 2 diabetes mellitus with diabetic chronic kidney disease: Secondary | ICD-10-CM | POA: Insufficient documentation

## 2018-08-31 DIAGNOSIS — N184 Chronic kidney disease, stage 4 (severe): Secondary | ICD-10-CM

## 2018-08-31 DIAGNOSIS — Z79899 Other long term (current) drug therapy: Secondary | ICD-10-CM | POA: Insufficient documentation

## 2018-08-31 NOTE — Progress Notes (Signed)
Advanced Heart Failure Clinic Note    Patient ID: Donald Dunlap, male   DOB: 04-27-1951, 67 y.o.   MRN: [id number]   PCP: Madelyn Flavors Primary Cardiologist: Dr Ladean Raya [facility]) Referring: Graciela Husbands (EP) HF: Dr. Gala Romney   HPI:  Donald Dunlap is a 67 y.o. male with a history of obesity, CAD with CABG in 2006 at [facility] complicated by sternal dehiscence, chronic atrial fib on warfarin, h/o multiple strokes, COPD on home O2, CKD 3 with a creatinine of 1.5-2.0. DM2, obesity, OSA, RBBB and chronic systolic HF. Referred to HF Clinic by Dr. Graciela Husbands.   Admitted 01/07/17-01/21/17 with volume overload and low output. RHC on 01/15/17 with Fick cardiac output/index 4.3/1.9. Echo with EF 30-35%, LA severely dilated.  Co ox 49%. Milrinone started to facilitate diuresis. Eventually weaned off. He was started on Entresto. Discharge weight 243 pounds.  He presents today for regular follow up. Here with his wife. No major events since last visit. Hasn't been able to get around much due to knee arthritis. Has gained 25 pounds. Can do ADLs but not much more. Sleeps on 4 pillows or in recliner. Having more edema. + LE wounds and has visit to [facility] next week. Has seen EP and have decided not to pursue CRT upgrade currently.   Labs in care everywhere 08/2017: K 4.6, Cr 1.61  Review of systems complete and found to be negative unless listed in HPI.    Past Medical History:  Diagnosis Date  . Atrial fibrillation, permanent   . Blood in stool   . Chronic systolic congestive heart failure, NYHA class 3 (HCC)   . Diabetes (HCC)   . Edema   . Esophageal reflux   . Gout   . HLD (hyperlipidemia)   . HTN (hypertension)   . Iron deficiency anemia 10/02/2016  . Ischemic cardiomyopathy   . Migraine aura, persistent   . Morbid obesity with BMI of 40.0-44.9, adult (HCC)   . OSA (obstructive sleep apnea)   . Peptic ulcer   . RBBB LAFB   . Renal disorder   . Venous insufficiency    SH:  Social History    Socioeconomic History  . Marital status: Married    Spouse name: Not on file  . Number of children: Not on file  . Years of education: Not on file  . Highest education level: Not on file  Occupational History  . Not on file  Social Needs  . Financial resource strain: Not on file  . Food insecurity:    Worry: Not on file    Inability: Not on file  . Transportation needs:    Medical: Not on file    Non-medical: Not on file  Tobacco Use  . Smoking status: Never Smoker  . Smokeless tobacco: Never Used  Substance and Sexual Activity  . Alcohol use: No  . Drug use: No  . Sexual activity: Not on file  Lifestyle  . Physical activity:    Days per week: Not on file    Minutes per session: Not on file  . Stress: Not on file  Relationships  . Social connections:    Talks on phone: Not on file    Gets together: Not on file    Attends religious service: Not on file    Active member of club or organization: Not on file    Attends meetings of clubs or organizations: Not on file    Relationship status: Not on file  . Intimate partner  violence:    Fear of current or ex partner: Not on file    Emotionally abused: Not on file    Physically abused: Not on file    Forced sexual activity: Not on file  Other Topics Concern  . Not on file  Social History Narrative  . Not on file   FH: No h/o familial cardiomyopathy or connective tissue disease  Current Outpatient Medications  Medication Sig Dispense Refill  . carvedilol (COREG) 3.125 MG tablet Take 6.25 mg by mouth 2 (two) times daily with a meal.    . ENTRESTO 49-51 MG TAKE 1 TABLET BY MOUTH TWICE DAILY 60 tablet 3  . furosemide (LASIX) 80 MG tablet Take 40-80 mg by mouth See admin instructions. Alternate taking 80mg  with 40mg  daily (80mg , 40mg , 80mg , etc..)    . metolazone (ZAROXOLYN) 2.5 MG tablet Take one tablet as needed for weight gain greater than 3 pounds in one day or 5 pounds in one week. 30 tablet 2  . warfarin (COUMADIN)  4 MG tablet Take 2-4 mg by mouth See admin instructions. 2mg  on Monday, Wednesday, and Friday then 4mg  on all other days    . acetaminophen (TYLENOL) 500 MG tablet Take 500 mg by mouth every 6 (six) hours as needed.    Marland Kitchen allopurinol (ZYLOPRIM) 100 MG tablet Take 100 mg by mouth daily.    . calcitRIOL (ROCALTROL) 0.25 MCG capsule Take 0.25 mcg by mouth daily.     . diphenhydrAMINE (BENADRYL) 25 MG tablet Take 25 mg by mouth every 6 (six) hours as needed for itching or allergies.    Marland Kitchen doxycycline (VIBRAMYCIN) 100 MG capsule Take 1 capsule (100 mg total) by mouth 2 (two) times daily. One po bid x 7 days 14 capsule 0  . glipiZIDE (GLUCOTROL) 5 MG tablet Take 5 mg by mouth 2 (two) times daily before a meal.     . HYDROcodone-acetaminophen (NORCO/VICODIN) 5-325 MG tablet Take 1 tablet by mouth every 6 (six) hours as needed for moderate pain. 10 tablet 0  . insulin glargine (LANTUS) 100 UNIT/ML injection Inject 25 Units into the skin every morning.     . insulin lispro (HUMALOG) 100 UNIT/ML injection Inject 10-20 Units into the skin 3 (three) times daily before meals. Sliding Scale    . Meclizine HCl (BONINE) 25 MG CHEW Chew 1-2 tablets by mouth daily as needed (for motion sickness).    . OXYGEN Inhale 2 L into the lungs daily.     . pantoprazole (PROTONIX) 40 MG tablet Take 40 mg by mouth daily.     Marland Kitchen spironolactone (ALDACTONE) 25 MG tablet Take 25 mg by mouth daily.     . traMADol (ULTRAM) 50 MG tablet Take 1 tablet (50 mg total) by mouth every 6 (six) hours as needed. 20 tablet 0   No current facility-administered medications for this encounter.    Vitals:   08/31/18 1147  BP: 140/90  Pulse: 77  SpO2: 92%  Weight: 121.6 kg (268 lb)   Wt Readings from Last 3 Encounters:  08/31/18 121.6 kg (268 lb)  02/21/18 109.3 kg (241 lb)  12/02/17 111.5 kg (245 lb 12 oz)    PHYSICAL EXAM: General:  Sitting in WC. No resp difficulty HEENT: normal Neck: supple. JVP 7 Carotids 2+ bilat; no bruits. No  lymphadenopathy or thryomegaly appreciated. Cor: PMI nondisplaced. Irregular rate & rhythm. Probable s3 Lungs: clear Abdomen: obese soft, nontender, nondistended. No hepatosplenomegaly. No bruits or masses. Good bowel sounds. Extremities: no cyanosis,  clubbing, rash, 2+ edema wounds on both shins Neuro: alert & orientedx3, cranial nerves grossly intact. moves all 4 extremities w/o difficulty. Affect pleasant  ECG: Likely atrial tach 84 with RBBB occasional PVCs Personally reviewed   ASSESSMENT & PLAN: 1. Chronic Systolic HF: ICM, Echo 12/2016 with EF 30-35%.  S/p [employer] ICD 10/14/2016. - Stable NYHA III.  - Volume status elevated on exam with LE wounds.  - Had advised him to double lasix to 80 bid for 3 days and take one dose of metolazone. Wrap legs with ACE wraps until he can get to Wound Clinic - Continue Spiro 25 mg BID. - Continue coreg 6.25 mg BID.  - Continue Entresto 49/51 mg BID.   - Reinforced fluid restriction to < 2 L daily, sodium restriction to less than 2000 mg daily, and the importance of daily weights.   - Scheduled for repeat echo with Dr. Ladean RayaSrinvisan.  - Has seen EP in [place] and decided not to proceed with CRT upgrade at this point - in the setting of RBBB unlikely to benefit - Not candidate for advanced HF therapies at this point due to size and comorbidities.  2. ICM: with history of CAD in 2006 - No current  s/s of ischemia. - No ASA with warfarin  3. Chronic A fib - CHADSVasc - 6. Rate controlled.   - Continue warfarin. Has occasional blood on toilet paper, but thinks it is his hemorrhoids. No other bleeding.  4. DM: - Per PCP.  5. CKD 3:  - Followed by Dr. Fausto SkillernBefakadu - Creatinine baseline about 1.5-1.6 - Last check 07/25/18: Creatinine 1.61 (GFR 44) in CareEverywhere 6. Gout - Stable.  7. Chronic respiratory failure - Stable. Uses 2L O2 as needed at home.  8. Obesity -Body mass index is 38.45 kg/m. 9. LE wounds - increase diuretics. - wrap legs - f/u  with Wound Care Clinic next week.   Arvilla Meresaniel Bensimhon, MD  12:18 PM

## 2018-08-31 NOTE — Patient Instructions (Signed)
Take Lasix 80mg  twice daily for 3 days.  Take Metolazone 2.5mg  today only.  Follow up with Dr.Bensimhon in 6months Please call our office at [phone] in May to schedule your June appointment.

## 2018-09-01 ENCOUNTER — Telehealth (HOSPITAL_COMMUNITY): Payer: Self-pay

## 2018-09-01 NOTE — Telephone Encounter (Signed)
Faxed progress note of DB per DB to Dr. [email or web address]'JSrinivisan's office. Fax successful.

## 2018-11-25 ENCOUNTER — Other Ambulatory Visit (HOSPITAL_COMMUNITY): Payer: Self-pay | Admitting: Internal Medicine

## 2019-01-20 DEATH — deceased
